# Patient Record
Sex: Male | Born: 1970 | Race: Black or African American | Hispanic: No | Marital: Single | Smoking: Never smoker
Health system: Southern US, Community
[De-identification: ages and names within clinical notes are randomized; demographics above are authoritative.]

## PROBLEM LIST (undated history)

## (undated) DIAGNOSIS — K4090 Unilateral inguinal hernia, without obstruction or gangrene, not specified as recurrent: Secondary | ICD-10-CM

## (undated) HISTORY — PX: OTHER SURGICAL HISTORY: SHX169

## (undated) HISTORY — PX: EYE SURGERY: SHX253

## (undated) HISTORY — DX: Unilateral inguinal hernia, without obstruction or gangrene, not specified as recurrent: K40.90

---

## 1997-09-06 ENCOUNTER — Emergency Department (HOSPITAL_COMMUNITY): Admission: EM | Admit: 1997-09-06 | Discharge: 1997-09-06 | Payer: Self-pay

## 2006-05-05 ENCOUNTER — Emergency Department (HOSPITAL_COMMUNITY): Admission: EM | Admit: 2006-05-05 | Discharge: 2006-05-05 | Payer: Self-pay | Admitting: Emergency Medicine

## 2008-04-30 ENCOUNTER — Emergency Department (HOSPITAL_COMMUNITY): Admission: EM | Admit: 2008-04-30 | Discharge: 2008-04-30 | Payer: Self-pay | Admitting: Emergency Medicine

## 2011-04-27 ENCOUNTER — Emergency Department (INDEPENDENT_AMBULATORY_CARE_PROVIDER_SITE_OTHER)
Admission: EM | Admit: 2011-04-27 | Discharge: 2011-04-27 | Disposition: A | Discharge status: Home / Self Care | Payer: Self-pay | Source: Home / Self Care | Attending: Family Medicine | Admitting: Family Medicine

## 2011-04-27 ENCOUNTER — Encounter (HOSPITAL_COMMUNITY): Payer: Self-pay | Admitting: *Deleted

## 2011-04-27 DIAGNOSIS — H6123 Impacted cerumen, bilateral: Secondary | ICD-10-CM

## 2011-04-27 DIAGNOSIS — M549 Dorsalgia, unspecified: Secondary | ICD-10-CM

## 2011-04-27 DIAGNOSIS — H6120 Impacted cerumen, unspecified ear: Secondary | ICD-10-CM

## 2011-04-27 MED ORDER — HYDROCODONE-ACETAMINOPHEN 5-325 MG PO TABS: ORAL_TABLET | ORAL | Status: AC

## 2011-04-27 MED ORDER — CYCLOBENZAPRINE HCL 5 MG PO TABS: 5.0000 mg | ORAL_TABLET | Freq: Three times a day (TID) | ORAL | Status: AC | PRN

## 2011-04-27 MED ORDER — NAPROXEN 375 MG PO TABS: 375.0000 mg | ORAL_TABLET | Freq: Two times a day (BID) | ORAL | Status: AC

## 2011-04-27 NOTE — ED Provider Notes (Addendum)
History     CSN: [id number]  Arrival date & time 04/27/11  1020   First MD Initiated Contact with Patient 04/27/11 1139      Chief Complaint  Patient presents with  . Back Pain  . Ear Fullness    (Consider location/radiation/quality/duration/timing/severity/associated sxs/prior treatment) HPI Comments: Glenn Kelly presents for evaluation with 2 complaints. He reports decreased hearing in his left ear over the last 2 weeks. He denies any injury to his ear. He reports that he has been using cotton swabs to clean ear wax. He also reports 3 days of low back pain. He reports the pain is in the middle of his back in the lumbar region. 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 He did not use a back brace at this job. He denies any radiation of the pain to his legs. He denies any numbness, tingling, or weakness.  Patient is a 41 y.o. male presenting with back pain and plugged ear sensation. The history is provided by the patient.  Back Pain  This is a new problem. The current episode started more than 2 days ago. The problem occurs constantly. The problem has not changed since onset.The pain is associated with lifting heavy objects. The pain is present in the lumbar spine. The quality of the pain is described as aching. The pain does not radiate. The pain is moderate. The symptoms are aggravated by twisting, bending and certain positions. Pertinent negatives include no numbness, no bowel incontinence, no bladder incontinence, no leg pain, no paresthesias, no paresis, no tingling and no weakness.  Ear Fullness This is a new problem. The current episode started more than 2 days ago. The problem occurs constantly. The problem has not changed since onset.The symptoms are aggravated by nothing. The symptoms are relieved by nothing.    No past medical history on file.  No past surgical history on file.  No family  history on file.  History  Substance Use Topics  . Smoking status: Not on file  . Smokeless tobacco: Not on file  . Alcohol Use: Not on file      Review of Systems  Constitutional: Negative.   HENT: Positive for hearing loss. Negative for ear pain, congestion, rhinorrhea, tinnitus and ear discharge.   Eyes: Negative.   Respiratory: Negative.   Cardiovascular: Negative.   Gastrointestinal: Negative.  Negative for bowel incontinence.  Genitourinary: Negative.  Negative for bladder incontinence.  Musculoskeletal: Positive for back pain.  Skin: Negative.   Neurological: Negative.  Negative for tingling, weakness, numbness and paresthesias.    Allergies  Review of patient's allergies indicates not on file.  Home Medications  No current outpatient prescriptions on file.  BP 116/72  Pulse 68  Temp(Src) 97.6 F (36.4 C) (Oral)  Resp 17  SpO2 97%  Physical Exam  Nursing note and vitals reviewed. Constitutional: He is oriented to person, place, and time. He appears well-developed and well-nourished.  HENT:  Head: Normocephalic and atraumatic.  Right Ear: External ear normal.  Left Ear: External ear normal.  Mouth/Throat: Uvula is midline, oropharynx is clear and moist and mucous membranes are normal.       Bilateral cerumen impaction  Eyes: EOM are normal.  Neck: Normal range of motion.  Pulmonary/Chest: Effort normal.  Musculoskeletal: Normal range of motion.       Lumbar back: He exhibits tenderness.       Back: negative  straight leg raise bilaterally, positive Fabers bilaterally, no pain with internal or external rotation bilaterally, full flexion, extension, abduction, and adduction; 5/5 strength  Neurological: He is alert and oriented to person, place, and time.  Skin: Skin is warm and dry.  Psychiatric: His behavior is normal.    ED Course  Procedures (including critical care time)  Labs Reviewed - No data to display No results found.   No diagnosis  found.    MDM  rx given for naproxen, cyclobenzaprine, and hydrocodone PRN, for back pain, with stretches. Hearing improved after ear irrigation.        Richardo Priest, MD 04/27/11 1323  Richardo Priest, MD 04/27/11 1326

## 2011-04-27 NOTE — ED Notes (Signed)
Pt with c/o left ear decreased hearing x 2 weeks - low back pain x 3 days - no known injury - pain worse with movement - taking ibuprofen

## 2011-05-19 ENCOUNTER — Emergency Department (INDEPENDENT_AMBULATORY_CARE_PROVIDER_SITE_OTHER)
Admission: EM | Admit: 2011-05-19 | Discharge: 2011-05-19 | Disposition: A | Discharge status: Home / Self Care | Payer: Self-pay | Source: Home / Self Care | Attending: Family Medicine | Admitting: Family Medicine

## 2011-05-19 ENCOUNTER — Encounter (HOSPITAL_COMMUNITY): Payer: Self-pay | Admitting: *Deleted

## 2011-05-19 DIAGNOSIS — K644 Residual hemorrhoidal skin tags: Secondary | ICD-10-CM

## 2011-05-19 MED ORDER — HYDROCORTISONE ACETATE 25 MG RE SUPP: 25.0000 mg | Freq: Two times a day (BID) | RECTAL | Status: AC

## 2011-05-19 MED ORDER — POLYETHYLENE GLYCOL 3350 17 GM/SCOOP PO POWD: 17.0000 g | Freq: Every day | ORAL | Status: AC

## 2011-05-19 MED ORDER — PSYLLIUM 30.9 % PO POWD: 1.0000 | ORAL | Status: DC

## 2011-05-19 MED ORDER — DOCUSATE SODIUM 100 MG PO CAPS: 100.0000 mg | ORAL_CAPSULE | Freq: Two times a day (BID) | ORAL | Status: AC

## 2011-05-19 NOTE — ED Provider Notes (Signed)
History     CSN: [id number]  Arrival date & time 05/19/11  1533   First MD Initiated Contact with Patient 05/19/11 1543      Chief Complaint  Patient presents with  . Hemorrhoids  . Rectal Pain    (Consider location/radiation/quality/duration/timing/severity/associated sxs/prior treatment) Patient is a 41 y.o. male presenting with hematochezia. The history is provided by the patient.  Rectal Bleeding  The current episode started today (No blood today but rectal pain and something coming from rectum). The problem occurs continuously. The problem has been unchanged. The pain is severe. The stool is described as hard (unable gto produce a stool today). There was no prior successful therapy. There was no prior unsuccessful therapy. Associated symptoms include rectal pain. Pertinent negatives include no anorexia, no fever, no abdominal pain, no diarrhea, no hematemesis, no nausea, no vomiting, no hematuria, no chest pain, no coughing, no difficulty breathing and no rash.    History reviewed. No pertinent past medical history.  Past Surgical History  Procedure Date  . Congenital heart      History reviewed. No pertinent family history.  History  Substance Use Topics  . Smoking status: Never Smoker   . Smokeless tobacco: Not on file  . Alcohol Use: No      Review of Systems  Constitutional: Negative for fever.  Respiratory: Negative for cough.   Cardiovascular: Negative for chest pain.  Gastrointestinal: Positive for hematochezia and rectal pain. Negative for nausea, vomiting, abdominal pain, diarrhea, anorexia and hematemesis.  Genitourinary: Negative for hematuria.  Skin: Negative for rash.  All other systems reviewed and are negative.    Allergies  Review of patient's allergies indicates no known allergies.  Home Medications   Current Outpatient Rx  Name Route Sig Dispense Refill  . HYDROCODONE-ACETAMINOPHEN 5-325 MG PO TABS Oral Take 1 tablet by mouth every 6  (six) hours as needed.    Marland Kitchen DOCUSATE SODIUM 100 MG PO CAPS Oral Take 1 capsule (100 mg total) by mouth 2 (two) times daily. 20 capsule 0  . HYDROCORTISONE ACETATE 25 MG RE SUPP Rectal Place 1 suppository (25 mg total) rectally 2 (two) times daily. 24 suppository 0  . IBUPROFEN 400 MG PO TABS Oral Take 400 mg by mouth every 6 (six) hours as needed.    Marland Kitchen NAPROXEN 375 MG PO TABS Oral Take 1 tablet (375 mg total) by mouth 2 (two) times daily. 20 tablet 0  . POLYETHYLENE GLYCOL 3350 PO POWD Oral Take 17 g by mouth daily. 255 g 0  . PSYLLIUM 30.9 % PO POWD Oral Take 1 applicator by mouth 1 day or 1 dose. 1 Bottle 1    BP 129/88  Pulse 82  Temp(Src) 98.2 F (36.8 C) (Oral)  Resp 16  SpO2 98%  Physical Exam  Vitals reviewed. Constitutional: He is oriented to person, place, and time. He appears well-developed.  HENT:  Head: Normocephalic.  Genitourinary: Prostate normal. Rectal exam shows external hemorrhoid. Rectal exam shows no internal hemorrhoid, no fissure, no mass, no tenderness and anal tone normal. Guaiac negative stool. Prostate is not enlarged and not tender.       Large hemorrhoid not thrombosed yet at 08:00  Neurological: He is alert and oriented to person, place, and time.  Skin: Skin is warm and dry.  Psychiatric: He has a normal mood and affect. His behavior is normal.    ED Course  Procedures (including critical care time)  Labs Reviewed - No data to display No results  found.   1. External hemorrhoid       MDM          Hassan Rowan, MD 05/19/11 2142

## 2011-05-19 NOTE — ED Notes (Signed)
Pt with onset of rectal pain x 2 days ? Hemorrhoids pt also with c/o intermittent toothache

## 2011-05-19 NOTE — Discharge Instructions (Signed)
If hemorrhoid gets worse it may require a trip for thombectomy. In the future any narcotic use may cause a reoccurence.     High Fiber Diet A high fiber diet changes your normal diet to include more whole grains, legumes, fruits, and vegetables. Changes in the diet involve replacing refined carbohydrates with unrefined foods. The calorie level of the diet is essentially unchanged. The Dietary Reference Intake (recommended amount) for adult males is 38 g per day. For adult females, it is 25 g per day. Pregnant and lactating women should consume 28 g of fiber per day. Fiber is the intact part of a plant that is not broken down during digestion. Functional fiber is fiber that has been isolated from the plant to provide a beneficial effect in the body. PURPOSE  Increase stool bulk.   Ease and regulate bowel movements.   Lower cholesterol.  INDICATIONS THAT YOU NEED MORE FIBER  Constipation and hemorrhoids.   Uncomplicated diverticulosis (intestine condition) and irritable bowel syndrome.   Weight management.   As a protective measure against hardening of the arteries (atherosclerosis), diabetes, and cancer.  NOTE OF CAUTION If you have a digestive or bowel problem, ask your caregiver for advice before adding high fiber foods to your diet. Some of the following medical problems are such that a high fiber diet should not be used without consulting your caregiver:  Acute diverticulitis (intestine infection).   Partial small bowel obstructions.   Complicated diverticular disease involving bleeding, rupture (perforation), or abscess (boil, furuncle).   Presence of autonomic neuropathy (nerve damage) or gastric paresis (stomach cannot empty itself).  GUIDELINES FOR INCREASING FIBER  Start adding fiber to the diet slowly. A gradual increase of about 5 more grams (2 slices of whole-wheat bread, 2 servings of most fruits or vegetables, or 1 bowl of high fiber cereal) per day is best. Too rapid  an increase in fiber may result in constipation, flatulence, and bloating.   Drink enough water and fluids to keep your urine clear or pale yellow. Water, juice, or caffeine-free drinks are recommended. Not drinking enough fluid may cause constipation.   Eat a variety of high fiber foods rather than one type of fiber.   Try to increase your intake of fiber through using high fiber foods rather than fiber pills or supplements that contain small amounts of fiber.   The goal is to change the types of food eaten. Do not supplement your present diet with high fiber foods, but replace foods in your present diet.  INCLUDE A VARIETY OF FIBER SOURCES  Replace refined and processed grains with whole grains, canned fruits with fresh fruits, and incorporate other fiber sources. White rice, white breads, and most bakery goods contain little or no fiber.   Brown whole-grain rice, buckwheat oats, and many fruits and vegetables are all good sources of fiber. These include: broccoli, Brussels sprouts, cabbage, cauliflower, beets, sweet potatoes, white potatoes (skin on), carrots, tomatoes, eggplant, squash, berries, fresh fruits, and dried fruits.   Cereals appear to be the richest source of fiber. Cereal fiber is found in whole grains and bran. Bran is the fiber-rich outer coat of cereal grain, which is largely removed in refining. In whole-grain cereals, the bran remains. In breakfast cereals, the largest amount of fiber is found in those with "bran" in their names. The fiber content is sometimes indicated on the label.   You may need to include additional fruits and vegetables each day.   In baking, for 1 cup  white flour, you may use the following substitutions:   1 cup whole-wheat flour minus 2 tbs.    cup white flour plus  cup whole-wheat flour.  Document Released: 03/11/2005 Document Revised: 11/21/2010 Document Reviewed: 01/17/2009 [facility] Patient Information 2012 [place],  [place].Hemorrhoids Hemorrhoids are enlarged (dilated) veins around the rectum. There are 2 types of hemorrhoids, and the type of hemorrhoid is determined by its location. Internal hemorrhoids occur in the veins just inside the rectum.They are usually not painful, but they may bleed.However, they may poke through to the outside and become irritated and painful. External hemorrhoids involve the veins outside the anus and can be felt as a painful swelling or hard lump near the anus.They are often itchy and may crack and bleed. Sometimes clots will form in the veins. This makes them swollen and painful. These are called thrombosed hemorrhoids. CAUSES Causes of hemorrhoids include:  Pregnancy. This increases the pressure in the hemorrhoidal veins.   Constipation.   Straining to have a bowel movement.   Obesity.   Heavy lifting or other activity that caused you to strain.  TREATMENT Most of the time hemorrhoids improve in 1 to 2 weeks. However, if symptoms do not seem to be getting better or if you have a lot of rectal bleeding, your caregiver may perform a procedure to help make the hemorrhoids get smaller or remove them completely.Possible treatments include:  Rubber band ligation. A rubber band is placed at the base of the hemorrhoid to cut off the circulation.   Sclerotherapy. A chemical is injected to shrink the hemorrhoid.   Infrared light therapy. Tools are used to burn the hemorrhoid.   Hemorrhoidectomy. This is surgical removal of the hemorrhoid.  HOME CARE INSTRUCTIONS   Increase fiber in your diet. Ask your caregiver about using fiber supplements.   Drink enough water and fluids to keep your urine clear or pale yellow.   Exercise regularly.   Go to the bathroom when you have the urge to have a bowel movement. Do not wait.   Avoid straining to have bowel movements.   Keep the anal area dry and clean.   Only take over-the-counter or prescription medicines for pain,  discomfort, or fever as directed by your caregiver.  If your hemorrhoids are thrombosed:  Take warm sitz baths for 20 to 30 minutes, 3 to 4 times per day.   If the hemorrhoids are very tender and swollen, place ice packs on the area as tolerated. Using ice packs between sitz baths may be helpful. Fill a plastic bag with ice. Place a towel between the bag of ice and your skin.   Medicated creams and suppositories may be used or applied as directed.   Do not use a donut-shaped pillow or sit on the toilet for long periods. This increases blood pooling and pain.  SEEK MEDICAL CARE IF:   You have increasing pain and swelling that is not controlled with your medicine.   You have uncontrolled bleeding.   You have difficulty or you are unable to have a bowel movement.   You have pain or inflammation outside the area of the hemorrhoids.   You have chills or an oral temperature above 102 F (38.9 C).  MAKE SURE YOU:   Understand these instructions.   Will watch your condition.   Will get help right away if you are not doing well or get worse.  Document Released: 03/08/2000 Document Revised: 11/21/2010 Document Reviewed: 07/14/2007 [facility] Patient Information 2012 [place], [place].

## 2012-08-16 ENCOUNTER — Emergency Department (HOSPITAL_COMMUNITY)
Admission: EM | Admit: 2012-08-16 | Discharge: 2012-08-16 | Disposition: A | Discharge status: Home / Self Care | Payer: Self-pay | Source: Home / Self Care | Attending: Family Medicine | Admitting: Family Medicine

## 2012-08-16 ENCOUNTER — Encounter (HOSPITAL_COMMUNITY): Payer: Self-pay | Admitting: *Deleted

## 2012-08-16 DIAGNOSIS — K602 Anal fissure, unspecified: Secondary | ICD-10-CM

## 2012-08-16 MED ORDER — HYDROCORTISONE ACE-PRAMOXINE 2.5-1 % RE CREA: TOPICAL_CREAM | Freq: Three times a day (TID) | RECTAL | Status: DC

## 2012-08-16 MED ORDER — POLYETHYLENE GLYCOL 3350 17 GM/SCOOP PO POWD: 17.0000 g | Freq: Every day | ORAL | Status: DC

## 2012-08-16 NOTE — ED Notes (Signed)
Hurts to have a BM onset 1 week ago.  Passed a little blood 2 days ago.  He had a small hemorrhoid 2 mos. Ago.  Tried Tucks with some relief.

## 2012-08-16 NOTE — ED Provider Notes (Signed)
History     CSN: [id number]  Arrival date & time 08/16/12  1407   First MD Initiated Contact with Patient 08/16/12 1556      Chief Complaint  Patient presents with  . Hemorrhoids    (Consider location/radiation/quality/duration/timing/severity/associated sxs/prior treatment) HPI Comments: 42 year old male with history of constipation and hemorrhoids. Here complaining of painful bowel movements for one week. Had seen bloodstained tissue after defecation 2 days ago. He has been diagnosed with external hemorrhoids in the past. Reports the pain is mostly during defecation. Denies throbbing pain. Reports pain is like burning type. Has tried topics with relief. Last bowel movement was this morning runny stools had hard stools the day before. Denies abdominal pain. No general malaise. No nausea or vomiting.   History reviewed. No pertinent past medical history.  Past Surgical History  Procedure Laterality Date  . Congenital heart       History reviewed. No pertinent family history.  History  Substance Use Topics  . Smoking status: Never Smoker   . Smokeless tobacco: Not on file  . Alcohol Use: No      Review of Systems  Constitutional: Negative for fever, chills and fatigue.  Gastrointestinal: Positive for constipation, blood in stool and rectal pain. Negative for nausea, vomiting, abdominal pain and diarrhea.  Musculoskeletal: Negative for back pain.  All other systems reviewed and are negative.    Allergies  Review of patient's allergies indicates no known allergies.  Home Medications   Current Outpatient Rx  Name  Route  Sig  Dispense  Refill  . hydrocortisone-pramoxine [facility]) 2.5-1 % rectal cream   Rectal   Place rectally 3 (three) times daily.   30 g   0   . polyethylene glycol powder (GLYCOLAX/MIRALAX) powder   Oral   Take 17 g by mouth daily.   255 g   0     BP 110/80  Pulse 66  Temp(Src) 97.6 F (36.4 C) (Oral)  Resp 16  SpO2  100%  Physical Exam  Nursing note and vitals reviewed. Constitutional: He is oriented to person, place, and time. He appears well-developed and well-nourished. No distress.  HENT:  Mouth/Throat: Oropharynx is clear and moist.  Eyes: No scleral icterus.  Neck: No JVD present.  Cardiovascular: Normal heart sounds.   Pulmonary/Chest: Breath sounds normal.  Abdominal: Soft. Bowel sounds are normal. He exhibits no distension and no mass. There is no tenderness. There is no rebound and no guarding.  Genitourinary: Guaiac negative stool.  Rectal digital exam: impress normal anal tone. No obvious external or internal hemorrhoids. There are few rectal excoriations. There is a healing rectal fissure at 7 oclock with a sentinel skin tag. No rectal mass. Rectoscopy: patient no cooperative. Unable to pass rectal speculum as patient was uncomfortable and increased gluteal and perirectal muscle tone. Exam was deferred.  Neurological: He is alert and oriented to person, place, and time.  Skin: He is not diaphoretic.    ED Course  Procedures (including critical care time)  Labs Reviewed - No data to display No results found.   1. Anal fissure       MDM  Prescribed hydrocortisone/pramoxine cream. Prescribed MiraLax when necessary. Supportive care and red flags that should prompt his return to medical attention discussed with patient and provided in writing. And        Sharin Grave, MD 08/17/12 1844

## 2012-08-25 NOTE — ED Notes (Signed)
E-mail message to Dr Tressia Danas regarding pt inquiry to see if anything else can be done

## 2012-08-25 NOTE — ED Notes (Signed)
Chart review. patient called, asking if there is anything else he can do about his pain , as sometimes the cream helps, sometimes not; what elae can he do about the pain

## 2014-01-06 ENCOUNTER — Emergency Department (HOSPITAL_COMMUNITY)
Admission: EM | Admit: 2014-01-06 | Discharge: 2014-01-06 | Disposition: A | Discharge status: Home / Self Care | Payer: Self-pay | Attending: Emergency Medicine | Admitting: Emergency Medicine

## 2014-01-06 ENCOUNTER — Encounter (HOSPITAL_COMMUNITY): Payer: Self-pay | Admitting: Emergency Medicine

## 2014-01-06 ENCOUNTER — Emergency Department (HOSPITAL_COMMUNITY): Payer: Self-pay

## 2014-01-06 DIAGNOSIS — R059 Cough, unspecified: Secondary | ICD-10-CM

## 2014-01-06 DIAGNOSIS — J029 Acute pharyngitis, unspecified: Secondary | ICD-10-CM | POA: Insufficient documentation

## 2014-01-06 DIAGNOSIS — R05 Cough: Secondary | ICD-10-CM

## 2014-01-06 LAB — RAPID STREP SCREEN (MED CTR MEBANE ONLY): STREPTOCOCCUS, GROUP A SCREEN (DIRECT): NEGATIVE

## 2014-01-06 MED ORDER — AZITHROMYCIN 250 MG PO TABS: ORAL_TABLET | ORAL | Status: DC

## 2014-01-06 MED ORDER — DEXAMETHASONE SODIUM PHOSPHATE 10 MG/ML IJ SOLN: 10.0000 mg | Freq: Once | INTRAMUSCULAR | Status: DC

## 2014-01-06 MED ORDER — DEXAMETHASONE 6 MG PO TABS
10.0000 mg | ORAL_TABLET | Freq: Once | ORAL | Status: AC
  Administered 2014-01-06: 10 mg via ORAL
  Filled 2014-01-06: qty 1

## 2014-01-06 MED ORDER — IBUPROFEN 200 MG PO TABS
600.0000 mg | ORAL_TABLET | Freq: Once | ORAL | Status: AC
  Administered 2014-01-06: 600 mg via ORAL
  Filled 2014-01-06: qty 3

## 2014-01-06 NOTE — ED Notes (Signed)
Pt ambulated to XRAY with steady gait 

## 2014-01-06 NOTE — ED Notes (Signed)
Pt ambulated to restroom with steady gait.

## 2014-01-06 NOTE — ED Notes (Signed)
Pt c/o sore throat and cold symptoms. Pt states started about 1 week ago, but not getting any better.

## 2014-01-06 NOTE — ED Provider Notes (Signed)
CSN: [id number]     Arrival date & time 01/06/14  0018 History   First MD Initiated Contact with Patient 01/06/14 0035     Chief Complaint  Patient presents with  . Sore Throat  . Nasal Congestion     (Consider location/radiation/quality/duration/timing/severity/associated sxs/prior Treatment) Patient is a 43 y.o. male presenting with pharyngitis.  Sore Throat This is a new problem. Episode onset: 1 week ago. The problem occurs constantly. The problem has not changed since onset.Pertinent negatives include no chest pain, no abdominal pain, no headaches and no shortness of breath. Nothing aggravates the symptoms. Nothing relieves the symptoms. He has tried nothing for the symptoms.    History reviewed. No pertinent past medical history. Past Surgical History  Procedure Laterality Date  . Congenital heart     . Eye surgery     History reviewed. No pertinent family history. History  Substance Use Topics  . Smoking status: Never Smoker   . Smokeless tobacco: Never Used  . Alcohol Use: No    Review of Systems  Respiratory: Negative for shortness of breath.   Cardiovascular: Negative for chest pain.  Gastrointestinal: Negative for abdominal pain.  Neurological: Negative for headaches.  All other systems reviewed and are negative.     Allergies  Review of patient's allergies indicates no known allergies.  Home Medications   Prior to Admission medications   Medication Sig Start Date End Date Taking? Authorizing Provider  acetaminophen (TYLENOL) 500 MG tablet Take 1,000 mg by mouth every 6 (six) hours as needed for mild pain.   Yes Historical Provider, MD  DM-Doxylamine-Acetaminophen 15-6.25-325 MG/15ML LIQD Take 30 mLs by mouth at bedtime as needed (sore throat).   Yes Historical Provider, MD  phenol (CHLORASEPTIC) 1.4 % LIQD Use as directed 1 spray in the mouth or throat as needed for throat irritation / pain.   Yes Historical Provider, MD  azithromycin (ZITHROMAX Z-PAK)  250 MG tablet Take 2 pills (500mg ) PO on day 1, followed by 1 pill PO daily x 4 days 01/06/14   Mirian MoMatthew Gentry, MD   BP 129/79  Pulse 69  Temp(Src) 98.8 F (37.1 C) (Oral)  Resp 16  SpO2 100% Physical Exam  Vitals reviewed. Constitutional: He is oriented to person, place, and time. He appears well-developed and well-nourished.  HENT:  Head: Normocephalic and atraumatic.  Mouth/Throat: Oropharynx is clear and moist and mucous membranes are normal.  Eyes: Conjunctivae and EOM are normal.  Neck: Normal range of motion. Neck supple.  Cardiovascular: Normal rate, regular rhythm and normal heart sounds.   Pulmonary/Chest: Effort normal and breath sounds normal. No respiratory distress.  Abdominal: He exhibits no distension. There is no tenderness. There is no rebound and no guarding.  Musculoskeletal: Normal range of motion.  Neurological: He is alert and oriented to person, place, and time.  Skin: Skin is warm and dry.    ED Course  Procedures (including critical care time) Labs Review Labs Reviewed  RAPID STREP SCREEN  CULTURE, GROUP A STREP    Imaging Review Dg Chest 2 View  01/06/2014   CLINICAL DATA:  Cough, sore throat, cold symptoms. Initial encounter.  EXAM: CHEST  2 VIEW  COMPARISON:  None.  FINDINGS: Normal cardiac silhouette and mediastinal contours. Evaluation of the retrosternal clear space obscured secondary to overlying soft tissues. Ill-defined heterogeneous opacities within the left lower lung. No pleural effusion or pneumothorax. There is mild eventration of the anterior aspect the right hemidiaphragm. Unchanged bones.  IMPRESSION: Left lower lung/  retrocardiac heterogeneous opacities, atelectasis versus infiltrate. A follow-up chest radiograph in 4 to 6 weeks after treatment is recommended to ensure resolution.   Electronically Signed   By: Simonne ComeJohn  Watts M.D.   On: 01/06/2014 02:36     EKG Interpretation None      MDM   Final diagnoses:  Cough  Sore throat     43 y.o. male without pertinent PMH presents with sore throat x 1 week, no fevers, as well as cough.  On arrival vitals and physical exam as above.  CXR with ? LLL infiltrate.    DC home with standard return precautions and azithromycin.   1. Cough   2. Sore throat         Mirian MoMatthew Gentry, MD 01/06/14 [phone]

## 2014-01-06 NOTE — Discharge Instructions (Signed)
Pneumonia °Pneumonia is an infection of the lungs.  °CAUSES °Pneumonia may be caused by bacteria or a virus. Usually, these infections are caused by breathing infectious particles into the lungs (respiratory tract). °SIGNS AND SYMPTOMS  °· Cough. °· Fever. °· Chest pain. °· Increased rate of breathing. °· Wheezing. °· Mucus production. °DIAGNOSIS  °If you have the common symptoms of pneumonia, your health care provider will typically confirm the diagnosis with a chest X-ray. The X-ray will show an abnormality in the lung (pulmonary infiltrate) if you have pneumonia. Other tests of your blood, urine, or sputum may be done to find the specific cause of your pneumonia. Your health care provider may also do tests (blood gases or pulse oximetry) to see how well your lungs are working. °TREATMENT  °Some forms of pneumonia may be spread to other people when you cough or sneeze. You may be asked to wear a mask before and during your exam. Pneumonia that is caused by bacteria is treated with antibiotic medicine. Pneumonia that is caused by the influenza virus may be treated with an antiviral medicine. Most other viral infections must run their course. These infections will not respond to antibiotics.  °HOME CARE INSTRUCTIONS  °· Cough suppressants may be used if you are losing too much rest. However, coughing protects you by clearing your lungs. You should avoid using cough suppressants if you can. °· Your health care provider may have prescribed medicine if he or she thinks your pneumonia is caused by bacteria or influenza. Finish your medicine even if you start to feel better. °· Your health care provider may also prescribe an expectorant. This loosens the mucus to be coughed up. °· Take medicines only as directed by your health care provider. °· Do not smoke. Smoking is a common cause of bronchitis and can contribute to pneumonia. If you are a smoker and continue to smoke, your cough may last several weeks after your  pneumonia has cleared. °· A cold steam vaporizer or humidifier in your room or home may help loosen mucus. °· Coughing is often worse at night. Sleeping in a semi-upright position in a recliner or using a couple pillows under your head will help with this. °· Get rest as you feel it is needed. Your body will usually let you know when you need to rest. °PREVENTION °A pneumococcal shot (vaccine) is available to prevent a common bacterial cause of pneumonia. This is usually suggested for: °· People over 65 years old. °· Patients on chemotherapy. °· People with chronic lung problems, such as bronchitis or emphysema. °· People with immune system problems. °If you are over 65 or have a high risk condition, you may receive the pneumococcal vaccine if you have not received it before. In some countries, a routine influenza vaccine is also recommended. This vaccine can help prevent some cases of pneumonia. You may be offered the influenza vaccine as part of your care. °If you smoke, it is time to quit. You may receive instructions on how to stop smoking. Your health care provider can provide medicines and counseling to help you quit. °SEEK MEDICAL CARE IF: °You have a fever. °SEEK IMMEDIATE MEDICAL CARE IF:  °· Your illness becomes worse. This is especially true if you are elderly or weakened from any other disease. °· You cannot control your cough with suppressants and are losing sleep. °· You begin coughing up blood. °· You develop pain which is getting worse or is uncontrolled with medicines. °· Any of the symptoms   which initially brought you in for treatment are getting worse rather than better. °· You develop shortness of breath or chest pain. °MAKE SURE YOU:  °· Understand these instructions. °· Will watch your condition. °· Will get help right away if you are not doing well or get worse. °Document Released: 03/11/2005 Document Revised: 07/26/2013 Document Reviewed: 05/31/2010 °ExitCare® Patient Information ©2015  [employer], LLC. This information is not intended to replace advice given to you by your health care provider. Make sure you discuss any questions you have with your health care provider. ° ° °Emergency Department Resource Guide °1) Find a Doctor and Pay Out of Pocket °Although you won't have to find out who is covered by your insurance plan, it is a good idea to ask around and get recommendations. You will then need to call the office and see if the doctor you have chosen will accept you as a new patient and what types of options they offer for patients who are self-pay. Some doctors offer discounts or will set up payment plans for their patients who do not have insurance, but you will need to ask so you aren't surprised when you get to your appointment. ° °2) Contact Your Local Health Department °Not all health departments have doctors that can see patients for sick visits, but many do, so it is worth a call to see if yours does. If you don't know where your local health department is, you can check in your phone book. The CDC also has a tool to help you locate your state's health department, and many state websites also have listings of all of their local health departments. ° °3) Find a Walk-in Clinic °If your illness is not likely to be very severe or complicated, you may want to try a walk in clinic. These are popping up all over the country in pharmacies, drugstores, and shopping centers. They're usually staffed by nurse practitioners or physician assistants that have been trained to treat common illnesses and complaints. They're usually fairly quick and inexpensive. However, if you have serious medical issues or chronic medical problems, these are probably not your best option. ° °No Primary Care Doctor: °- Call [facility] at  832-8000 - they can help you locate a primary care doctor that  accepts your insurance, provides certain services, etc. °- Physician Referral Service- 1-800-533-3463 ° °Chronic Pain  Problems: °Organization         Address  Phone   Notes  °[facility]  (336) 297-2271 Patients need to be referred by their primary care doctor.  ° °Medication Assistance: °Organization         Address  Phone   Notes  °[place] [facility] [address] °[place], [place] [address] (336) 641-8030 --Must be a resident of [place] °-- Must have NO insurance coverage whatsoever (no Medicaid/ Medicare, etc.) °-- The pt. MUST have a primary care doctor that directs their care regularly and follows them in the community °  °[facility]  (866) 331-1348   °[employer] Way  (888) 892-1162   ° °Agencies that provide inexpensive medical care: °Organization         Address  Phone   Notes  °[facility]  (336) 832-8035   °[facility]    (336) 832-7272   °[facility] [address] °[place], [place] [address] (336) 832-4777   °[facility] [address], °[place] (336) 271-4999   °  [facility]anned [facility]    (336) 373-0678   °[facility]    (336) 272-1050   °[facility] ° [address], [place] Phone:  (336) 832-4444, Fax:  (336) 832-4440 Hours of Operation:  9 am - 6 pm, M-F.  Also accepts Medicaid/Medicare and self-pay.  °[facility] ° [address], [place] Phone: (336) 832-3150, Fax: (336) [phone]. Hours of Operation:  8:30 am - 5:30 pm, M-F.  Also accepts Medicaid and self-pay.  °[facility] [place] [address], [place] Phone: (336) 878-6027   °[facility] [address], [place], [place] (336)723-1848, Ext. 123 Mondays & Thursdays: 7-9 AM.  First 15 patients are seen on a first come, first serve basis. °  ° °Medicaid-accepting [place] Providers: ° °Organization         Address  Phone   Notes  °[facility] [address], [place] (336) 641-2100 Also  accepts self-pay patients.  °[facility] [address], [place] ° (336) 856-9996   °[facility] [address], [place] (336) 288-8857   °[facility] [address] [place] [address], [place] (336) 299-7000   °Veita Bland [address], [place]  ° (336) 373-1557 Only accepts [place] Access Medicaid patients after they have their name applied to their card.  ° °Self-Pay (no insurance) in [place]: ° °Organization         Address  Phone   Notes  °Sickle Cell Patients, [facility] [address], [place] (336) 832-1970   °[facility] [address], [place] (336) 832-4400   ° [facility] ° [address], [place] (336) 992-4800   °[facility]/Dr. Osei-Bonsu ° [address] [place] [address], [place] or [address], [place] (336) 841-8500 Phone number for both [place] and [place] locations is the same.  °[facility] [address], [place] (336) 299-0000   °[facility] [address] [place] [address], [place] or [address] (336) 852-7530 °(336) 878-2260   °[facility] [address], [place] (336) 350-1642, phone; (336) 294-5005, fax Sees patients 1st and 3rd Saturday of every month.  Must not qualify for public or private insurance (i.e. Medicaid, Medicare, [place] Health Choice, Veterans' Benefits) • Household income should be no more than 200% of the poverty level •The clinic cannot treat you if you are pregnant or think you are pregnant • Sexually transmitted diseases are not treated at the clinic.  ° ° °Dental Care: °Organization         Address  Phone  Notes  °[place] [facility] [address], [place] (336) 641-6152 Accepts children up to age 21 who are enrolled in Medicaid or [place] Health Choice; pregnant  women with a Medicaid card; and children who have applied for Medicaid or [place] Health Choice, but were declined, whose parents can pay a reduced fee at time of service.  °[facility] [place]  [address], [place] (336) 641-7733 Accepts children up to age 21 who are enrolled in Medicaid or [place] Health Choice; pregnant women with a Medicaid card; and children who have applied for Medicaid or  Health Choice, but were declined,   whose parents can pay a reduced fee at time of service.  °[facility] ° [address], [place] (336) 641-4533 Patients are seen by appointment only. Walk-ins are not accepted. [facility] will see patients 18 years of age and older. °Monday - Tuesday (8am-5pm) °Most Wednesdays (8:30-5pm) °$30 per visit, cash only  °[facility] ° [address], [place] (336) 641-4533 Patients are seen by appointment only. Walk-ins are not accepted. [facility] will see patients 18 years of age and older. °One Wednesday Evening (Monthly: Volunteer Based).  $30 per visit, cash only  °[facility]  (919) [phone] for adults; Children under age 4, call Graduate Pediatric Dentistry at ([phone]) [phone]. Children aged 4-14, please call ([phone]) [phone] to request a pediatric application. ° Dental services are provided in all areas of dental care including fillings, crowns and bridges, complete and partial dentures, implants, gum treatment, root canals, and extractions. Preventive care is also provided. Treatment is provided to both adults and children. °Patients are selected via a lottery and there is often a waiting list. °  °[facility] [address], °[place] ° (336) 763-8833 [email or web address] °  °[facility] [address], [place], [place] (336)723-1848, Ext. 123 Second and Fourth Thursday of each month, opens at 6:30 AM; Clinic ends at 9 AM.  Patients are  seen on a first-come first-served basis, and a limited number are seen during each clinic.  ° °[facility] ° [address], [place], [place] (336) 723-7904   Eligibility Requirements °You must have lived in [place], [place], or [place] counties for at least the last three months. °  You cannot be eligible for state or federal sponsored healthcare insurance, including Veterans Administration, Medicaid, or Medicare. °  You generally cannot be eligible for healthcare insurance through your employer.  °  How to apply: °Eligibility screenings are held every Tuesday and Wednesday afternoon from 1:00 pm until 4:00 pm. You do not need an appointment for the interview!  °[facility] [address], [place], [place] 336-631-2330   °[facility]  336-342-8273   °[facility]  336-703-3100   °[facility]  336-570-6415   ° °Behavioral Health Resources in the Community: °Intensive Outpatient Programs °Organization         Address  Phone  Notes  °[facility] [address], [place], [place] 336-878-6098   °[facility] [address], [place], [place] 336-832-9800   °[facility] [address], [place], [place] ° 336-882-2125   °[facility] [address],  °[place], [place] 1-800-853-5163 or 336-641-4981   °Substance Abuse Resources °Organization         Address  Phone  Notes  °[facility]  336-882-2125   °[facility]  336-784-9470   °[facility]  336-285-9073   °[facility]  336-845-3988   °Residential & Outpatient Substance Abuse Program  1-800-659-3381   °Psychological Services °Organization         Address  Phone  Notes  ° Health  336- 832-9600   °[facility]  336- 378-7881   °[facility] [address],  1-800-853-5163 or 336-641-4981   ° °Mobile Crisis  Teams °Organization         Address  Phone  Notes  °[facility]   Crisis Care Unit  [phone]   [facility]  [address] [place], [place] [phone]   [facility] [address] [place] [phone]    Self-Help/Support Groups Organization         Address  Phone             Notes  [facility] [place] - variety of support groups  336- I7437963(670)130-5702 Call for more information  Narcotics Anonymous (NA), [facility] [address], [place]  2 meetings at this location   [facility] Programs Organization         Address  Phone  Notes  [facility] [address],    [place]  [phone]   [facility]  [address], [place] [phone], [place], [place] [phone]   [facility] [address], [place] [phone] Admissions: 8am-3pm M-F  [facility] [address]    [place], [place] [phone]   [facility] [address], [place], [place] [phone]   [facility] [address]  [place], [place] [phone]   [facility] Int[facility] Outpatient [address], [place], [place] [phone]   [facility] [address]  [place], [place] [phone] or [phone]) [phone]   [facility] [address] [place], [place] [phone] Accepts Medicaid  [facility] [address]  [place] [phone] Substance Abuse/Addiction Treatment   [facility] Organization         Address  Phone  Notes  [facility]  [phone](888) [phone]   Angie FavaJulie Brannon, PhD [address], [address] [place], [place]   [phone](336) [phone] or [phone](336) [phone]   [facility]   [address] [place], [place] [phone]) [phone]   [facility] [address], [place], [place] [phone]) [phone]  Insurance/Medicaid/sponsorship through [facility]  [facility] [address]                                    [place], [place] [phone]) [phone] Therapy/tele-psych/case  [facility] [address]   [place], [place] [phone](336) [phone]    Dr. Lolly MustacheArfeen  [phone]) [phone]   [facility]  [facility] 1) [address],  2) [address], [place] 3)  [address], [place] [phone]) [phone](336) [phone]  [phone](336) [phone]   [facility] Child Abuse Hotline 343-638-2523(336) 308-570-4939 or [phone](336) [phone] (After Hours)      Sore Throat A sore throat is pain, burning, irritation, or scratchiness of the throat. There is often pain or tenderness when swallowing or talking. A sore throat may be accompanied by other symptoms, such as coughing, sneezing, fever, and swollen neck glands. A sore throat is often the first sign of another sickness, such as a cold, flu, strep throat, or mononucleosis (commonly known as mono). Most sore throats go away without medical treatment. CAUSES  The most common causes of a sore throat include:  A viral infection, such as a cold, flu, or mono.  A bacterial infection, such as strep throat, tonsillitis, or whooping cough.  Seasonal allergies.  Dryness in the air.  Irritants, such as smoke or pollution.  Gastroesophageal reflux disease (GERD). HOME CARE INSTRUCTIONS   Only take over-the-counter  medicines as directed by your caregiver.  Drink enough fluids to keep your urine clear or pale yellow.  Rest as needed.  Try using throat sprays, lozenges, or sucking on hard candy to ease any pain (if older than 4 years or as directed).  Sip warm liquids, such as broth, herbal tea, or warm water with honey to relieve pain temporarily. You may also eat or drink cold or frozen liquids such as frozen ice pops.  Gargle with salt water (mix 1 tsp salt with 8 oz of water).  Do not smoke and avoid secondhand  smoke.  Put a cool-mist humidifier in your bedroom at night to moisten the air. You can also turn on a hot shower and sit in the bathroom with the door closed for 5-10 minutes. SEEK IMMEDIATE MEDICAL CARE IF:  You have difficulty breathing.  You are unable to swallow fluids, soft foods, or your saliva.  You have increased swelling in the throat.  Your sore throat does not get better in 7 days.  You have nausea and vomiting.  You have a fever or persistent symptoms for more than 2-3 days.  You have a fever and your symptoms suddenly get worse. MAKE SURE YOU:   Understand these instructions.  Will watch your condition.  Will get help right away if you are not doing well or get worse. Document Released: 04/18/2004 Document Revised: 02/26/2012 Document Reviewed: 11/17/2011 [facility] Patient Information 2015 [place], [place]. This information is not intended to replace advice given to you by your health care provider. Make sure you discuss any questions you have with your health care provider.

## 2014-01-07 LAB — CULTURE, GROUP A STREP

## 2015-04-06 ENCOUNTER — Emergency Department (HOSPITAL_COMMUNITY)
Admission: EM | Admit: 2015-04-06 | Discharge: 2015-04-06 | Disposition: A | Discharge status: Home / Self Care | Payer: No Typology Code available for payment source | Attending: Emergency Medicine | Admitting: Emergency Medicine

## 2015-04-06 ENCOUNTER — Encounter (HOSPITAL_COMMUNITY): Payer: Self-pay

## 2015-04-06 DIAGNOSIS — S39012A Strain of muscle, fascia and tendon of lower back, initial encounter: Secondary | ICD-10-CM | POA: Diagnosis not present

## 2015-04-06 DIAGNOSIS — S4992XA Unspecified injury of left shoulder and upper arm, initial encounter: Secondary | ICD-10-CM | POA: Diagnosis not present

## 2015-04-06 DIAGNOSIS — Y92410 Unspecified street and highway as the place of occurrence of the external cause: Secondary | ICD-10-CM | POA: Insufficient documentation

## 2015-04-06 DIAGNOSIS — S0990XA Unspecified injury of head, initial encounter: Secondary | ICD-10-CM | POA: Diagnosis not present

## 2015-04-06 DIAGNOSIS — S161XXA Strain of muscle, fascia and tendon at neck level, initial encounter: Secondary | ICD-10-CM

## 2015-04-06 DIAGNOSIS — S3992XA Unspecified injury of lower back, initial encounter: Secondary | ICD-10-CM | POA: Diagnosis present

## 2015-04-06 DIAGNOSIS — Y9389 Activity, other specified: Secondary | ICD-10-CM | POA: Insufficient documentation

## 2015-04-06 DIAGNOSIS — Z79899 Other long term (current) drug therapy: Secondary | ICD-10-CM | POA: Diagnosis not present

## 2015-04-06 DIAGNOSIS — Y998 Other external cause status: Secondary | ICD-10-CM | POA: Insufficient documentation

## 2015-04-06 MED ORDER — IBUPROFEN 800 MG PO TABS: 800.0000 mg | ORAL_TABLET | Freq: Three times a day (TID) | ORAL | Status: DC

## 2015-04-06 MED ORDER — IBUPROFEN 800 MG PO TABS
800.0000 mg | ORAL_TABLET | Freq: Once | ORAL | Status: AC
  Administered 2015-04-06: 800 mg via ORAL
  Filled 2015-04-06: qty 1

## 2015-04-06 MED ORDER — METHOCARBAMOL 500 MG PO TABS
500.0000 mg | ORAL_TABLET | Freq: Once | ORAL | Status: AC
  Administered 2015-04-06: 500 mg via ORAL
  Filled 2015-04-06: qty 1

## 2015-04-06 MED ORDER — METHOCARBAMOL 500 MG PO TABS: 500.0000 mg | ORAL_TABLET | Freq: Two times a day (BID) | ORAL | Status: DC

## 2015-04-06 MED ORDER — HYDROCODONE-ACETAMINOPHEN 5-325 MG PO TABS
1.0000 | ORAL_TABLET | Freq: Once | ORAL | Status: DC
  Filled 2015-04-06: qty 1
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
## 2015-04-06 NOTE — Discharge Instructions (Signed)
1. Medications: robaxin, ibuprofen, usual home medications °2. Treatment: rest, drink plenty of fluids, gentle stretching as discussed, alternate ice and heat °3. Follow Up: Please followup with your primary doctor in 3 days for discussion of your diagnoses and further evaluation after today's visit; if you do not have a primary care doctor use the resource guide provided to find one;  Return to the ER for worsening back pain, difficulty walking, loss of bowel or bladder control or other concerning symptoms ° ° ° ° °Cervical Sprain °A cervical sprain is an injury in the neck in which the strong, fibrous tissues (ligaments) that connect your neck bones stretch or tear. Cervical sprains can range from mild to severe. Severe cervical sprains can cause the neck vertebrae to be unstable. This can lead to damage of the spinal cord and can result in serious nervous system problems. The amount of time it takes for a cervical sprain to get better depends on the cause and extent of the injury. Most cervical sprains heal in 1 to 3 weeks. °CAUSES  °Severe cervical sprains may be caused by:  °· Contact sport injuries (such as from football, rugby, wrestling, hockey, auto racing, gymnastics, diving, martial arts, or boxing).   °· Motor vehicle collisions.   °· Whiplash injuries. This is an injury from a sudden forward and backward whipping movement of the head and neck.  °· Falls.   °Mild cervical sprains may be caused by:  °· Being in an awkward position, such as while cradling a telephone between your ear and shoulder.   °· Sitting in a chair that does not offer proper support.   °· Working at a poorly designed computer station.   °· Looking up or down for long periods of time.   °SYMPTOMS  °· Pain, soreness, stiffness, or a burning sensation in the front, back, or sides of the neck. This discomfort may develop immediately after the injury or slowly, 24 hours or more after the injury.   °· Pain or tenderness directly in the  middle of the back of the neck.   °· Shoulder or upper back pain.   °· Limited ability to move the neck.   °· Headache.   °· Dizziness.   °· Weakness, numbness, or tingling in the hands or arms.   °· Muscle spasms.   °· Difficulty swallowing or chewing.   °· Tenderness and swelling of the neck.   °DIAGNOSIS  °Most of the time your health care provider can diagnose a cervical sprain by taking your history and doing a physical exam. Your health care provider will ask about previous neck injuries and any known neck problems, such as arthritis in the neck. X-rays may be taken to find out if there are any other problems, such as with the bones of the neck. Other tests, such as a CT scan or MRI, may also be needed.  °TREATMENT  °Treatment depends on the severity of the cervical sprain. Mild sprains can be treated with rest, keeping the neck in place (immobilization), and pain medicines. Severe cervical sprains are immediately immobilized. Further treatment is done to help with pain, muscle spasms, and other symptoms and may include: °· Medicines, such as pain relievers, numbing medicines, or muscle relaxants.   °· Physical therapy. This may involve stretching exercises, strengthening exercises, and posture training. Exercises and improved posture can help stabilize the neck, strengthen muscles, and help stop symptoms from returning.   °HOME CARE INSTRUCTIONS  °· Put ice on the injured area.   °¨ Put ice in a plastic bag.   °¨ Place a towel between your skin and the   bag.   °¨ Leave the ice on for 15-20 minutes, 3-4 times a day.   °· If your injury was severe, you may have been given a cervical collar to wear. A cervical collar is a two-piece collar designed to keep your neck from moving while it heals. °¨ Do not remove the collar unless instructed by your health care provider. °¨ If you have long hair, keep it outside of the collar. °¨ Ask your health care provider before making any adjustments to your collar. Minor  adjustments may be required over time to improve comfort and reduce pressure on your chin or on the back of your head. °¨ If you are allowed to remove the collar for cleaning or bathing, follow your health care provider's instructions on how to do so safely. °¨ Keep your collar clean by wiping it with mild soap and water and drying it completely. If the collar you have been given includes removable pads, remove them every 1-2 days and hand wash them with soap and water. Allow them to air dry. They should be completely dry before you wear them in the collar. °¨ If you are allowed to remove the collar for cleaning and bathing, wash and dry the skin of your neck. Check your skin for irritation or sores. If you see any, tell your health care provider. °¨ Do not drive while wearing the collar.   °· Only take over-the-counter or prescription medicines for pain, discomfort, or fever as directed by your health care provider.   °· Keep all follow-up appointments as directed by your health care provider.   °· Keep all physical therapy appointments as directed by your health care provider.   °· Make any needed adjustments to your workstation to promote good posture.   °· Avoid positions and activities that make your symptoms worse.   °· Warm up and stretch before being active to help prevent problems.   °SEEK MEDICAL CARE IF:  °· Your pain is not controlled with medicine.   °· You are unable to decrease your pain medicine over time as planned.   °· Your activity level is not improving as expected.   °SEEK IMMEDIATE MEDICAL CARE IF:  °· You develop any bleeding. °· You develop stomach upset. °· You have signs of an allergic reaction to your medicine.   °· Your symptoms get worse.   °· You develop new, unexplained symptoms.   °· You have numbness, tingling, weakness, or paralysis in any part of your body.   °MAKE SURE YOU:  °· Understand these instructions. °· Will watch your condition. °· Will get help right away if you are not  doing well or get worse. °  °This information is not intended to replace advice given to you by your health care provider. Make sure you discuss any questions you have with your health care provider. °  °Document Released: 01/06/2007 Document Revised: 03/16/2013 Document Reviewed: 09/16/2012 °Elsevier Interactive Patient Education ©2016 [employer]vier Inc[employer] ° ° ° °Emergency Department Resource Guide °1) Find a Doctor and Pay Out of Pocket °Although you won't have to find out who is covered by your insurance plan, it is a good idea to ask around and get recommendations. You will then need to call the office and see if the doctor you have chosen will accept you as a new patient and what types of options they offer for patients who are self-pay. Some doctors offer discounts or will set up payment plans for their patients who do not have insurance, but you will need to ask so you aren't surprised when you get   to your appointment. ° °2) Contact Your Local Health Department °Not all health departments have doctors that can see patients for sick visits, but many do, so it is worth a call to see if yours does. If you don't know where your local health department is, you can check in your phone book. The CDC also has a tool to help you locate your state's health department, and many state websites also have listings of all of their local health departments. ° °3) Find a Walk-in Clinic °If your illness is not likely to be very severe or complicated, you may want to try a walk in clinic. These are popping up all over the country in pharmacies, drugstores, and shopping centers. They're usually staffed by nurse practitioners or physician assistants that have been trained to treat common illnesses and complaints. They're usually fairly quick and inexpensive. However, if you have serious medical issues or chronic medical problems, these are probably not your best option. ° °No Primary Care Doctor: °- Call [facility] at  832-8000 -  they can help you locate a primary care doctor that  accepts your insurance, provides certain services, etc. °- Physician Referral Service- 1-800-533-3463 ° °Chronic Pain Problems: °Organization         Address  Phone   Notes  °[facility]  (336) 297-2271 Patients need to be referred by their primary care doctor.  ° °Medication Assistance: °Organization         Address  Phone   Notes  °[place] [facility] [address] °[place], [place] [address] (336) 641-8030 --Must be a resident of [place] °-- Must have NO insurance coverage whatsoever (no Medicaid/ Medicare, etc.) °-- The pt. MUST have a primary care doctor that directs their care regularly and follows them in the community °  °[facility]  (866) 331-1348   °[employer]  (888) 892-1162   ° °Agencies that provide inexpensive medical care: °Organization         Address  Phone   Notes  °[facility]  (336) 832-8035   °[facility]    (336) 832-7272   °[facility] [address] °[place], [place] [address] (336) 832-4777   °[facility] [address], °[place] (336) 271-4999   °[facility]    (336) 373-0678   °[facility]    (336) 272-1050   °[facility] ° [address], [place] Phone:  (336) 832-4444, Fax:  (336) 832-4440 Hours of Operation:  9 am - 6 pm, M-F.  Also accepts Medicaid/Medicare and self-pay.  °[facility] ° [address], [place] Phone: (336) [phone], Fax: (336) [phone]. Hours of Operation:  8:30 am - 5:30 pm, M-F.  Also accepts Medicaid and self-pay.  °[facility] [place] [address], [place] Phone: (336) 878-6027   °[facility] [address], [place], [place] (336)723-1848, Ext. 123 Mondays & Thursdays: 7-9 AM.  First 15 patients are seen on a first come, first serve basis. °  ° °Medicaid-accepting  [place] Providers: ° °Organization         Address  Phone   Notes  °[facility] [address],  (336) [phone] Also accepts self-pay patients.  °[facility] [address],  ° (336) 856-9996   °[facility] [address], [place] (336) 288-8857   °[facility] [address] [place] [address], [place] (336) 299-7000   °Veita Bland [address], [place]  ° (336) 373-1557 Only accepts [employer] Access Medicaid patients after they have their name applied to their card.  ° °Self-Pay (no insurance) in [place]: ° °Organization         Address  Phone   Notes  °Sickle Cell Patients, [facility] [address], [place] (336) 832-1970   °[facility] [address], [place] (336) 832-4400   °[facility] ° [address], [place] (336) 992-4800   °[facility]/Dr. Osei-Bonsu ° [address] [place] [address], [place] or [address], [place] (336) [phone] Phone number for both [place] and [place] locations is the same.  °[facility] [address], [place] (336) 299-0000   °[facility] [address] [place] [address], [place] or [address] (336) 852-7530 °(336) 878-2260   °[facility] [address], [place] (336) 350-1642, phone; (336) 294-5005, fax Sees patients 1st and 3rd Saturday of every month.  Must not qualify for public or private insurance (i.e. Medicaid, Medicare, [place] Health Choice, Veterans' Benefits) • Household income should be no more than 200% of the poverty level •The clinic cannot treat you if you are pregnant or think you are pregnant • Sexually transmitted diseases are not treated at the clinic.  ° ° °Dental Care: °Organization         Address  Phone  Notes  °[place] [facility] [address], [place] (336) 641-6152 Accepts children up to age 21 who are enrolled in Medicaid or [place] Health Choice; pregnant women with a Medicaid card; and children who have applied for Medicaid or [place] Health Choice, but were declined, whose parents can pay a reduced fee at time of service.  °[facility] [place]  [address], [place] (336) 641-7733 Accepts children up to age 21 who are enrolled in Medicaid or [place] Health Choice; pregnant women with a Medicaid card; and children who have applied for Medicaid or [place] Health Choice, but were declined, whose parents can pay a reduced fee at time of service.  °[facility] ° [address], [place] (336) 641-4533 Patients are seen by appointment only. Walk-ins are not accepted. [facility] will see patients 18 years of age and older. °Monday - Tuesday (8am-5pm) °Most Wednesdays (8:30-5pm) °$30 per visit, cash only  °[facility] ° [address], [place] (336) 641-4533 Patients are seen by appointment only. Walk-ins are not accepted. [facility] will see patients 18 years of age and older. °One Wednesday Evening (Monthly: Volunteer Based).  $30 per visit, cash only  °[facility]  (919) 537-3737 for adults; Children under age 4, call Graduate Pediatric Dentistry at ([phone]) [phone]. Children aged 4-14, please call ([phone]) [phone] to request a pediatric application. ° Dental services are provided in all areas of dental care including fillings, crowns and bridges, complete and partial dentures, implants, gum treatment, root canals, and extractions. Preventive care is also provided. Treatment is provided to both adults and children. °Patients are selected via a lottery and there is often a waiting list. °  °[facility] [address], °[place] ° (336) 763-8833 [email or web address] °  °[facility] [address], [place], [place] (336)723-1848, Ext. 123 Second and Fourth Thursday of each month, opens at 6:30 AM; Clinic ends at 9 AM.  Patients are seen on a first-come first-served basis, and a limited number are seen during each clinic.  ° °[facility] ° [address], [place], [place] (336) 723-7904   Eligibility Requirements °You must have lived in [place], [place], or [place] counties for at least the last three months. °  You cannot be eligible for state or federal sponsored healthcare insurance, including Veterans Administration, Medicaid, or Medicare. °  You generally cannot be eligible for healthcare insurance through your employer.  °  How to apply: °Eligibility screenings are held every Tuesday and Wednesday afternoon from 1:00 pm until 4:00 pm. You do not need an appointment for the interview!  °[facility] [address], [place], [place] 336-631-2330   °[facility]  336-342-8273   °[facility]  336-703-3100   ° [facility]  336-570-6415   ° °Behavioral Health Resources in the Community: °Intensive Outpatient Programs °Organization         Address  Phone  Notes  °[facility] [address], [place], [place] 336-878-6098   °[facility] [address], [place], [place] 336-832-9800   °[facility] [address], [place], [place] ° 336-882-2125   °[facility] [address],  °[place], [place] 1-800-853-5163 or 336-641-4981   °Substance Abuse Resources °Organization         Address  Phone  Notes  °[facility]  336-882-2125   °[facility]  336-784-9470   °[facility]  336-285-9073   °[facility]  336-845-3988   °Residential & Outpatient Substance Abuse Program  1-800-659-3381   °Psychological Services °Organization         Address  Phone  Notes  °[facility]   336- 832-9600   °[facility]  336- 378-7881   °[facility] [address], [place] 1-800-853-5163 or 336-641-4981   ° °Mobile Crisis Teams °Organization         Address  Phone  Notes  °[facility], Mobile Crisis Care Unit  1-877-626-1772   °Assertive °[facility] ° [address] [place], [place] 336-834-9664   °Sharon DeEsch [address] °[place] 336-554-5454   ° °Self-Help/Support Groups °Organization         Address  Phone             Notes  °[facility] - variety of support groups  336- 373-1402 Call for more information  °Narcotics Anonymous (NA), [facility] [address], °[place]  2 meetings at this location  ° °Residential Treatment Programs °Organization         Address  Phone  Notes  °[facility] [address],    °[place]  1-866-801-8205   °[facility] ° [address], [place], [place] 704-293-8524   °[facility] [address], [place] 336-845-3988 Admissions: 8am-3pm M-F  °[facility] [address]    °[place],  336-841-1104   °[facility] [address], [place],  336-379-7146   °[facility] [address]  °,    336-285-9073   °[facility] - Intensive Outpatient [address], [place], [place] 336-852-3033   °[facility] [address]  °[place], [place] 1-877-615-2722 or 336-784-9470   °[facility] [address] [place], [place] 336-227-7417 Accepts Medicaid  °[facility] [address]  °[place] 1-800-659-3381 Substance Abuse/Addiction Treatment  ° °[place] Behavioral Health Resources °Organization         Address  Phone  Notes  °[facility]  (888) 581-9988   °Julie Brannon, PhD [address] [place], [place]   (336) 349-5553 or  (336) 951-0000   °[facility]   [address] °[place], [place] (336) 349-4454   °[facility] [address], [place], [place] (336) 342-8316 Insurance/Medicaid/sponsorship through [facility]  °[facility] [address]                                    [place], [place] (336) 342-8316 Therapy/tele-psych/case  °[facility] [address]  ° [place], [place] (336) 349-2233    °Dr. Arfeen  (336) 349-4544   °[facility] [place]  [employer] [place] [facility] 1) [address],  °2) [address], [place] °3)  [address], [place] (336) 349-3220 °(336) 342-7768 ° °(336) 342-8140   °[place] [facility] (336) 342-1394 or (336) 342-3537 (After Hours)    ° ° ° °

## 2015-06-07 ENCOUNTER — Emergency Department (HOSPITAL_COMMUNITY)
Admission: EM | Admit: 2015-06-07 | Discharge: 2015-06-08 | Disposition: A | Discharge status: Home / Self Care | Payer: Self-pay | Attending: Emergency Medicine | Admitting: Emergency Medicine

## 2015-06-07 DIAGNOSIS — R1013 Epigastric pain: Secondary | ICD-10-CM | POA: Insufficient documentation

## 2015-06-07 DIAGNOSIS — R1011 Right upper quadrant pain: Secondary | ICD-10-CM | POA: Insufficient documentation

## 2015-06-07 DIAGNOSIS — Z791 Long term (current) use of non-steroidal anti-inflammatories (NSAID): Secondary | ICD-10-CM | POA: Insufficient documentation

## 2015-06-07 NOTE — ED Notes (Signed)
Patient presents for lower abdominal pain x1 day, denies N/V/D, fever/chills, urinary symptoms. Rates pain 6/10.

## 2015-06-08 ENCOUNTER — Encounter (HOSPITAL_COMMUNITY): Payer: Self-pay | Admitting: Emergency Medicine

## 2015-06-08 ENCOUNTER — Emergency Department (HOSPITAL_COMMUNITY): Payer: Self-pay

## 2015-06-08 LAB — URINALYSIS, ROUTINE W REFLEX MICROSCOPIC
Bilirubin Urine: NEGATIVE
Glucose, UA: NEGATIVE mg/dL
Hgb urine dipstick: NEGATIVE
Ketones, ur: NEGATIVE mg/dL
LEUKOCYTES UA: NEGATIVE
NITRITE: NEGATIVE
PROTEIN: NEGATIVE mg/dL
SPECIFIC GRAVITY, URINE: 1.036 — AB (ref 1.005–1.030)
pH: 6 (ref 5.0–8.0)

## 2015-06-08 LAB — COMPREHENSIVE METABOLIC PANEL
ALK PHOS: 66 U/L (ref 38–126)
ALT: 17 U/L (ref 17–63)
ANION GAP: 7 (ref 5–15)
AST: 21 U/L (ref 15–41)
Albumin: 4.1 g/dL (ref 3.5–5.0)
BUN: 18 mg/dL (ref 6–20)
CALCIUM: 8.9 mg/dL (ref 8.9–10.3)
CO2: 24 mmol/L (ref 22–32)
Chloride: 111 mmol/L (ref 101–111)
Creatinine, Ser: 0.78 mg/dL (ref 0.61–1.24)
Glucose, Bld: 108 mg/dL — ABNORMAL HIGH (ref 65–99)
Potassium: 4 mmol/L (ref 3.5–5.1)
SODIUM: 142 mmol/L (ref 135–145)
TOTAL PROTEIN: 7.3 g/dL (ref 6.5–8.1)
Total Bilirubin: 0.9 mg/dL (ref 0.3–1.2)

## 2015-06-08 LAB — CBC
HCT: 41.8 % (ref 39.0–52.0)
HEMOGLOBIN: 14.8 g/dL (ref 13.0–17.0)
MCH: 31.6 pg (ref 26.0–34.0)
MCHC: 35.4 g/dL (ref 30.0–36.0)
MCV: 89.1 fL (ref 78.0–100.0)
PLATELETS: 258 10*3/uL (ref 150–400)
RBC: 4.69 MIL/uL (ref 4.22–5.81)
RDW: 12.1 % (ref 11.5–15.5)
WBC: 6.6 10*3/uL (ref 4.0–10.5)

## 2015-06-08 LAB — LIPASE, BLOOD: Lipase: 28 U/L (ref 11–51)

## 2015-06-08 MED ORDER — GI COCKTAIL ~~LOC~~
30.0000 mL | Freq: Once | ORAL | Status: AC
Start: 2015-06-08 — End: 2015-06-08
  Administered 2015-06-08: 30 mL via ORAL
  Filled 2015-06-08: qty 30

## 2015-06-08 MED ORDER — SUCRALFATE 1 G PO TABS: 1.0000 g | ORAL_TABLET | Freq: Three times a day (TID) | ORAL | Status: DC

## 2015-06-08 MED ORDER — FAMOTIDINE 20 MG PO TABS: 20.0000 mg | ORAL_TABLET | Freq: Two times a day (BID) | ORAL | Status: DC

## 2015-06-08 NOTE — ED Provider Notes (Signed)
CSN: [id number]     Arrival date & time 06/07/15  2355 History   First MD Initiated Contact with Patient 06/08/15 0131     Chief Complaint  Patient presents with  . Abdominal Pain     (Consider location/radiation/quality/duration/timing/severity/associated sxs/prior Treatment) HPI Glenn Kelly is a 45 y.o. male presents emergency department complaining of epigastric abdominal pain, intermittently for 2 days. Patient states that symptoms come and go. He reports that pain is sometimes worse after eating. Patient denies any nausea or vomiting. No diarrhea. States he hasn't had a bowel movement in several days which is unusual for him. He denies any blood in his stool or his urine. He states he has been eating and drinking well. He describes pain as sharp. Nothing is making it better or worse. He tried to take ibuprofen and Tylenol which she states did not help. He denies similar pain in the past. No prior abdominal surgeries. No fever or chills. He reports pain radiating from the epigastric area to the chest. States he is currently pain-free, however he states "just prior to walking and pain was there."  History reviewed. No pertinent past medical history. Past Surgical History  Procedure Laterality Date  . Congenital heart     . Eye surgery     No family history on file. Social History  Substance Use Topics  . Smoking status: Never Smoker   . Smokeless tobacco: Never Used  . Alcohol Use: No    Review of Systems  Constitutional: Negative for fever and chills.  Respiratory: Negative for cough, chest tightness and shortness of breath.   Cardiovascular: Negative for chest pain, palpitations and leg swelling.  Gastrointestinal: Positive for abdominal pain. Negative for nausea, vomiting, diarrhea and abdominal distention.  Genitourinary: Negative for dysuria, urgency, frequency and hematuria.  Musculoskeletal: Negative for myalgias, arthralgias, neck pain and neck stiffness.  Skin:  Negative for rash.  Allergic/Immunologic: Negative for immunocompromised state.  Neurological: Negative for dizziness, weakness, light-headedness, numbness and headaches.  All other systems reviewed and are negative.     Allergies  Review of patient's allergies indicates no known allergies.  Home Medications   Prior to Admission medications   Medication Sig Start Date End Date Taking? Authorizing Provider  acetaminophen (TYLENOL) 500 MG tablet Take 1,000 mg by mouth every 6 (six) hours as needed for mild pain.   Yes Historical Provider, MD  naproxen sodium (ANAPROX) 220 MG tablet Take 220 mg by mouth 2 (two) times daily with a meal.   Yes Historical Provider, MD  ibuprofen (ADVIL,MOTRIN) 800 MG tablet Take 1 tablet (800 mg total) by mouth 3 (three) times daily. Patient not taking: Reported on 06/08/2015 04/06/15   Dahlia Client Muthersbaugh, PA-C  methocarbamol (ROBAXIN) 500 MG tablet Take 1 tablet (500 mg total) by mouth 2 (two) times daily. Patient not taking: Reported on 06/08/2015 04/06/15   Dahlia Client Muthersbaugh, PA-C   BP 116/83 mmHg  Pulse 78  Temp(Src) 97.9 F (36.6 C) (Oral)  Resp 18  Ht  (1.702 m)  Wt 77.111 kg  BMI 26.62 kg/m2  SpO2 98% Physical Exam  Constitutional: He appears well-developed and well-nourished. No distress.  HENT:  Head: Normocephalic and atraumatic.  Eyes: Conjunctivae are normal.  Neck: Neck supple.  Cardiovascular: Normal rate, regular rhythm and normal heart sounds.   Pulmonary/Chest: Effort normal. No respiratory distress. He has no wheezes. He has no rales.  Abdominal: Soft. Bowel sounds are normal. He exhibits no distension. There is tenderness. There is no  rebound and no guarding.  The right upper quadrant tenderness  Musculoskeletal: He exhibits no edema.  Neurological: He is alert.  Skin: Skin is warm and dry.  Nursing note and vitals reviewed.   ED Course  Procedures (including critical care time) Labs Review Labs Reviewed   COMPREHENSIVE METABOLIC PANEL - Abnormal; Notable for the following:    Glucose, Bld 108 (*)    All other components within normal limits  URINALYSIS, ROUTINE W REFLEX MICROSCOPIC (NOT AT [facility]) - Abnormal; Notable for the following:    Specific Gravity, Urine 1.036 (*)    All other components within normal limits  LIPASE, BLOOD  CBC    Imaging Review [place] Abdomen Complete  06/08/2015  CLINICAL DATA:  Abdominal pain for 2 days EXAM: ABDOMEN ULTRASOUND COMPLETE COMPARISON:  None. FINDINGS: Gallbladder: No gallstones or wall thickening visualized. No sonographic Murphy sign noted by sonographer. Common bile duct: Diameter: 4 mm Liver: Lobulated but overall simple 18 mm cyst in the central liver. No evidence of solid mass. Within normal limits in parenchymal echogenicity. IVC: No abnormality visualized. Pancreas: Essentially obscured. Spleen: Size and appearance within normal limits. Right Kidney: Length: 11 cm. Echogenicity within normal limits. No mass or hydronephrosis visualized. Left Kidney: Length: 11 cm. Echogenicity within normal limits. No mass or hydronephrosis visualized. Abdominal aorta: No aneurysm visualized. IMPRESSION: 1. No explanation for abdominal pain. 2. Obscured pancreas. Electronically Signed   By: Marnee SpringJonathon  Watts M.D.   On: 06/08/2015 03:01   I have personally reviewed and evaluated these images and lab results as part of my medical decision-making.   EKG Interpretation None      MDM   Final diagnoses:  Epigastric pain   Patient with intermittent right upper quadrant epigastric tenderness. Abdomen is otherwise soft, no guarding or rebound tenderness, doubt surgical abdomen. He is not a drinker, does not take ibuprofen frequently, he is not a smoker, he has low risk for gastric ulcers, however still in differential. Question GERD versus gallbladder disease. Check labs and get ultrasound of the abdomen. Will try GI cocktail for pain.  3:18 AM Patient is pain-free.  Ultrasound is negative. Blood work all unremarkable. No evidence of peritonitis. Pain is mainly epigastric. Most likely acid reflux. We will try Carafate and Pepcid. Will discharge home with outpatient follow-up. Return precautions discussed especially if pain is worsening.  Filed Vitals:   06/08/15 0003  BP: 116/83  Pulse: 78  Temp: 97.9 F (36.6 C)  TempSrc: Oral  Resp: 18  Height: 5\' 7"  (1.702 m)  Weight: 16.10977.111 kg  SpO2: 98%     Jaynie Crumbleatyana Keiland Pickering, PA-C 06/08/15 0319  Paula LibraJohn Molpus, MD 06/08/15 [id number]

## 2015-06-08 NOTE — ED Notes (Signed)
No answer from waiting room.

## 2015-06-08 NOTE — ED Notes (Signed)
Ultrasound at bedside

## 2015-06-08 NOTE — Discharge Instructions (Signed)
pepcid as prescribed daily. carafate as needed. Follow up with primary care doctor. Return if worsening or not improving.   Gastroesophageal Reflux Disease, Adult Normally, food travels down the esophagus and stays in the stomach to be digested. However, when a person has gastroesophageal reflux disease (GERD), food and stomach acid move back up into the esophagus. When this happens, the esophagus becomes sore and inflamed. Over time, GERD can create small holes (ulcers) in the lining of the esophagus.  CAUSES This condition is caused by a problem with the muscle between the esophagus and the stomach (lower esophageal sphincter, or LES). Normally, the LES muscle closes after food passes through the esophagus to the stomach. When the LES is weakened or abnormal, it does not close properly, and that allows food and stomach acid to go back up into the esophagus. The LES can be weakened by certain dietary substances, medicines, and medical conditions, including:  Tobacco use.  Pregnancy.  Having a hiatal hernia.  Heavy alcohol use.  Certain foods and beverages, such as coffee, chocolate, onions, and peppermint. RISK FACTORS This condition is more likely to develop in:  People who have an increased body weight.  People who have connective tissue disorders.  People who use NSAID medicines. SYMPTOMS Symptoms of this condition include:  Heartburn.  Difficult or painful swallowing.  The feeling of having a lump in the throat.  Abitter taste in the mouth.  Bad breath.  Having a large amount of saliva.  Having an upset or bloated stomach.  Belching.  Chest pain.  Shortness of breath or wheezing.  Ongoing (chronic) cough or a night-time cough.  Wearing away of tooth enamel.  Weight loss. Different conditions can cause chest pain. Make sure to see your health care provider if you experience chest pain. DIAGNOSIS Your health care provider will take a medical history and  perform a physical exam. To determine if you have mild or severe GERD, your health care provider may also monitor how you respond to treatment. You may also have other tests, including:  An endoscopy toexamine your stomach and esophagus with a small camera.  A test thatmeasures the acidity level in your esophagus.  A test thatmeasures how much pressure is on your esophagus.  A barium swallow or modified barium swallow to show the shape, size, and functioning of your esophagus. TREATMENT The goal of treatment is to help relieve your symptoms and to prevent complications. Treatment for this condition may vary depending on how severe your symptoms are. Your health care provider may recommend:  Changes to your diet.  Medicine.  Surgery. HOME CARE INSTRUCTIONS Diet  Follow a diet as recommended by your health care provider. This may involve avoiding foods and drinks such as:  Coffee and tea (with or without caffeine).  Drinks that containalcohol.  Energy drinks and sports drinks.  Carbonated drinks or sodas.  Chocolate and cocoa.  Peppermint and mint flavorings.  Garlic and onions.  Horseradish.  Spicy and acidic foods, including peppers, chili powder, curry powder, vinegar, hot sauces, and barbecue sauce.  Citrus fruit juices and citrus fruits, such as oranges, lemons, and limes.  Tomato-based foods, such as red sauce, chili, salsa, and pizza with red sauce.  Fried and fatty foods, such as donuts, french fries, potato chips, and high-fat dressings.  High-fat meats, such as hot dogs and fatty cuts of red and white meats, such as rib eye steak, sausage, ham, and bacon.  High-fat dairy items, such as whole milk, butter,  and cream cheese.  Eat small, frequent meals instead of large meals.  Avoid drinking large amounts of liquid with your meals.  Avoid eating meals during the 2-3 hours before bedtime.  Avoid lying down right after you eat.  Do not exercise right  after you eat. General Instructions  Pay attention to any changes in your symptoms.  Take over-the-counter and prescription medicines only as told by your health care provider. Do not take aspirin, ibuprofen, or other NSAIDs unless your health care provider told you to do so.  Do not use any tobacco products, including cigarettes, chewing tobacco, and e-cigarettes. If you need help quitting, ask your health care provider.  Wear loose-fitting clothing. Do not wear anything tight around your waist that causes pressure on your abdomen.  Raise (elevate) the head of your bed 6 inches (15cm).  Try to reduce your stress, such as with yoga or meditation. If you need help reducing stress, ask your health care provider.  If you are overweight, reduce your weight to an amount that is healthy for you. Ask your health care provider for guidance about a safe weight loss goal.  Keep all follow-up visits as told by your health care provider. This is important. SEEK MEDICAL CARE IF:  You have new symptoms.  You have unexplained weight loss.  You have difficulty swallowing, or it hurts to swallow.  You have wheezing or a persistent cough.  Your symptoms do not improve with treatment.  You have a hoarse voice. SEEK IMMEDIATE MEDICAL CARE IF:  You have pain in your arms, neck, jaw, teeth, or back.  You feel sweaty, dizzy, or light-headed.  You have chest pain or shortness of breath.  You vomit and your vomit looks like blood or coffee grounds.  You faint.  Your stool is bloody or black.  You cannot swallow, drink, or eat.   This information is not intended to replace advice given to you by your health care provider. Make sure you discuss any questions you have with your health care provider.   Document Released: 12/19/2004 Document Revised: 11/30/2014 Document Reviewed: 07/06/2014 Elsevier Interactive Patient Education [employer] Inc[employer]

## 2016-02-15 ENCOUNTER — Encounter (HOSPITAL_COMMUNITY): Payer: Self-pay | Admitting: Emergency Medicine

## 2016-02-15 ENCOUNTER — Emergency Department (HOSPITAL_COMMUNITY)
Admission: EM | Admit: 2016-02-15 | Discharge: 2016-02-15 | Disposition: A | Discharge status: Home / Self Care | Payer: Self-pay | Attending: Emergency Medicine | Admitting: Emergency Medicine

## 2016-02-15 DIAGNOSIS — H6123 Impacted cerumen, bilateral: Secondary | ICD-10-CM | POA: Insufficient documentation

## 2016-02-15 DIAGNOSIS — Y999 Unspecified external cause status: Secondary | ICD-10-CM | POA: Insufficient documentation

## 2016-02-15 DIAGNOSIS — Y939 Activity, unspecified: Secondary | ICD-10-CM | POA: Insufficient documentation

## 2016-02-15 DIAGNOSIS — Y929 Unspecified place or not applicable: Secondary | ICD-10-CM | POA: Insufficient documentation

## 2016-02-15 DIAGNOSIS — W57XXXA Bitten or stung by nonvenomous insect and other nonvenomous arthropods, initial encounter: Secondary | ICD-10-CM | POA: Insufficient documentation

## 2016-02-15 DIAGNOSIS — S40869A Insect bite (nonvenomous) of unspecified upper arm, initial encounter: Secondary | ICD-10-CM | POA: Insufficient documentation

## 2016-02-15 MED ORDER — PERMETHRIN 5 % EX CREA: TOPICAL_CREAM | CUTANEOUS | 1 refills | Status: DC

## 2016-02-15 NOTE — ED Triage Notes (Signed)
Pt c/o "rash" on arms and neck. Pt has multiple raised red spots on arms. Pt c/o itchiness. Pt sts they could be bed bugs. Pt also c/o L ear fullness x a couple of weeks. Pt sts it's a little harder to hear out of the L ear. A&Ox4 and ambulatory.

## 2016-02-15 NOTE — ED Provider Notes (Signed)
[facility]-EMERGENCY DEPT Provider Note   CSN: [id number] Arrival date & time: 02/15/16  1113     History   Chief Complaint Chief Complaint  Patient presents with  . Rash  . Ear Fullness    HPI Glenn Kelly is a 45 y.o. male.  Patient presents to the ED with a chief complaint of bug bites.  He states that he noticed bites on his arms and neck yesterday.  States that they have increased in sized and in severity of the itching.  He states that he has been living in a hotel.  He is concerned about bed bugs.  He also complains of ear fullness.  He denies any fevers or chills.  He states that he has tried over the counter ear drops.  He denies any other associated symptoms.   The history is provided by the patient. No language interpreter was used.    History reviewed. No pertinent past medical history.  There are no active problems to display for this patient.   Past Surgical History:  Procedure Laterality Date  . congenital heart     . EYE SURGERY         Home Medications    Prior to Admission medications   Not on File    Family History No family history on file.  Social History Social History  Substance Use Topics  . Smoking status: Never Smoker  . Smokeless tobacco: Never Used  . Alcohol use No     Allergies   Patient has no known allergies.   Review of Systems Review of Systems  HENT: Positive for hearing loss.   Skin: Positive for rash.  All other systems reviewed and are negative.    Physical Exam Updated Vital Signs BP 132/80 (BP Location: Right Arm)   Pulse 86   Temp 98.2 F (36.8 C) (Oral)   Resp 16   SpO2 96%   Physical Exam  Constitutional: He is oriented to person, place, and time. No distress.  HENT:  Head: Normocephalic and atraumatic.  Bilateral cerumen impaction  Eyes: Conjunctivae and EOM are normal. Pupils are equal, round, and reactive to light.  Neck: No tracheal deviation present.  Cardiovascular: Normal rate.     Pulmonary/Chest: Effort normal. No respiratory distress.  Abdominal: Soft.  Musculoskeletal: Normal range of motion.  Neurological: He is alert and oriented to person, place, and time.  Skin: Skin is warm and dry. He is not diaphoretic.  Scattered bites on upper extremities without evidence of abscess or cellulitis  Psychiatric: Judgment normal.  Nursing note and vitals reviewed.    ED Treatments / Results  Labs (all labs ordered are listed, but only abnormal results are displayed) Labs Reviewed - No data to display  EKG  EKG Interpretation None       Radiology No results found.  Procedures Procedures (including critical care time)  Medications Ordered in ED Medications - No data to display   Initial Impression / Assessment and Plan / ED Course  I have reviewed the triage vital signs and the nursing notes.  Pertinent labs & imaging results that were available during my care of the patient were reviewed by me and considered in my medical decision making (see chart for details).  Clinical Course     Patient with bug bites on upper extremities.  Likely bed bugs, possibly scabies.  Will treat with permethrin.  Also has bilateral cerumen impaction.  Recommend OTC earwax drops and flushing ears at home.  Final Clinical Impressions(s) / ED Diagnoses   Final diagnoses:  Bilateral impacted cerumen  Insect bite, initial encounter    New Prescriptions New Prescriptions   PERMETHRIN (ELIMITE) 5 % CREAM    Apply to entire body other than face - let sit for 14 hours then wash off, may repeat in 1 week if still having symptoms     Roxy HorsemanRobert Dorothe Elmore, PA-C 02/15/16 1208    Mancel BaleElliott Wentz, MD 02/16/16 [phone]

## 2017-02-06 IMAGING — US US ABDOMEN COMPLETE
1 series · 14 of 25 positions shown · non-contrast
Comparison: None.

CLINICAL DATA: Abdominal pain for 2 days

EXAM:
ABDOMEN ULTRASOUND COMPLETE

[Series 1: us abdomen complete · 0.26mm/px · 14 of 88 slices shown]
[im 1/88]
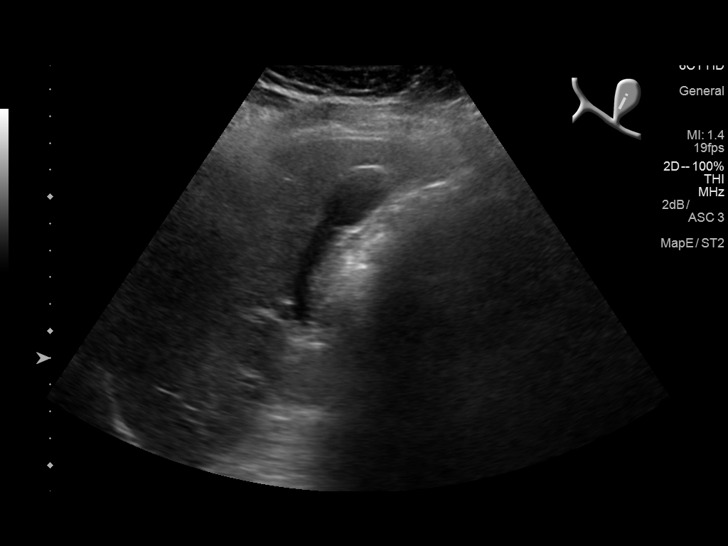
[im 8/88]
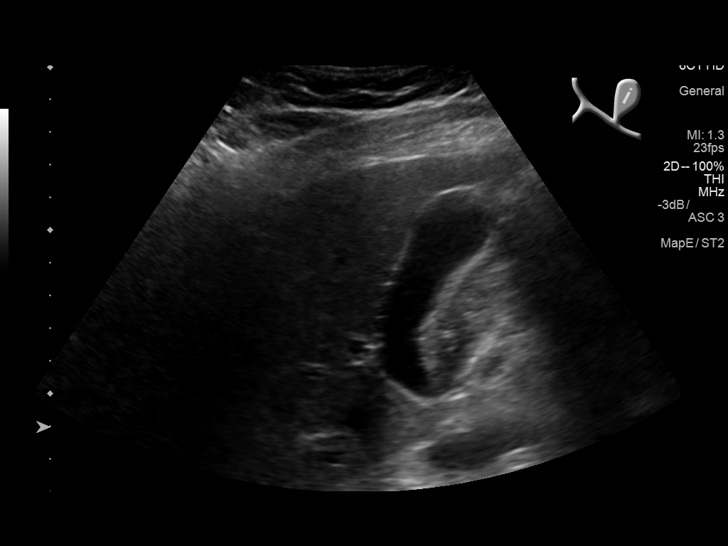
[im 15/88]
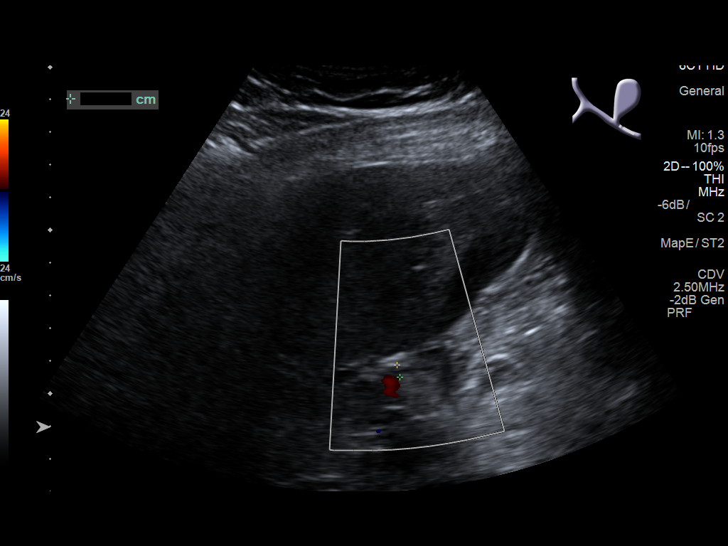
[im 22/88]
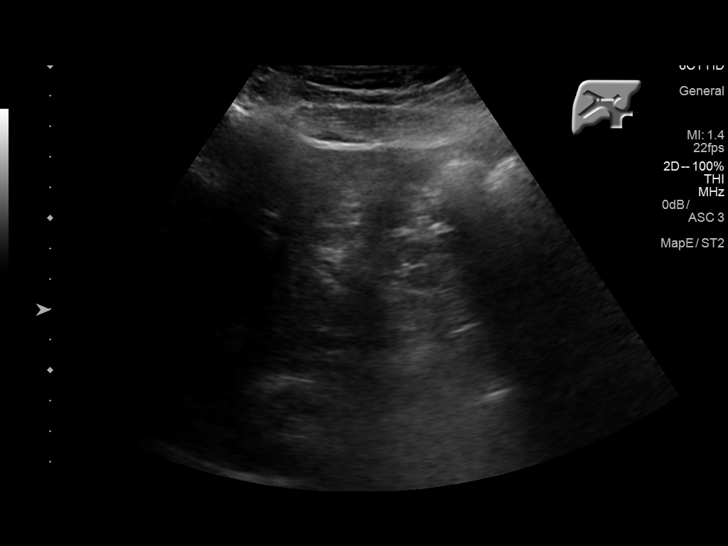
[im 30/88]
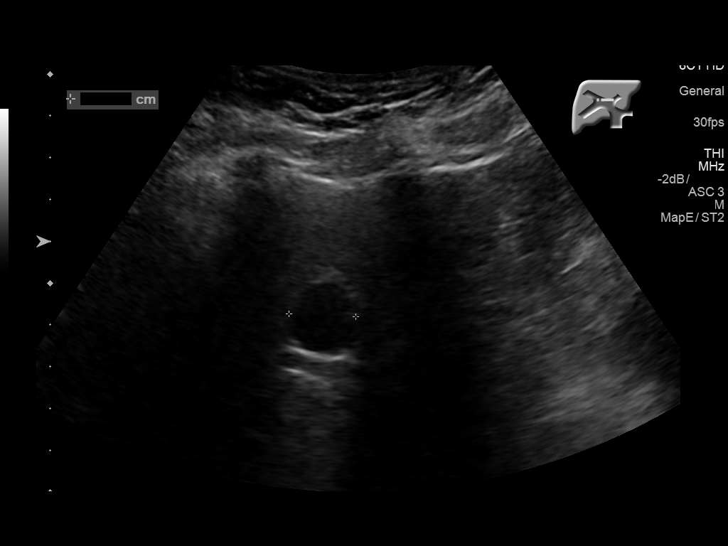
[im 33/88]
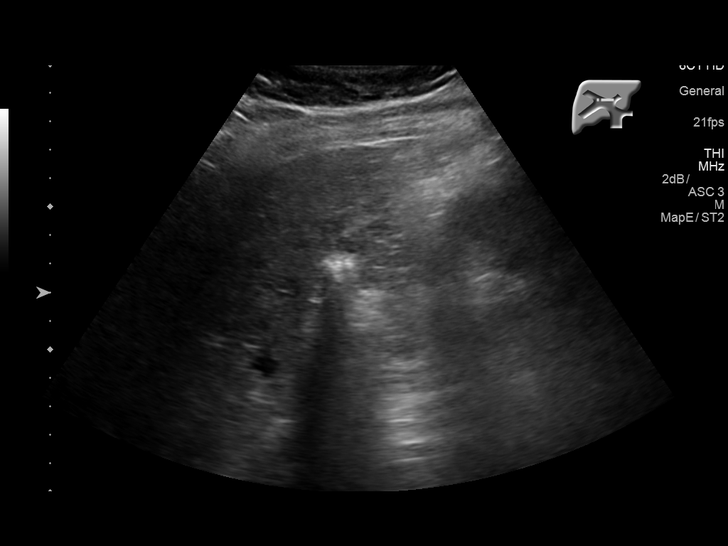
[im 40/88]
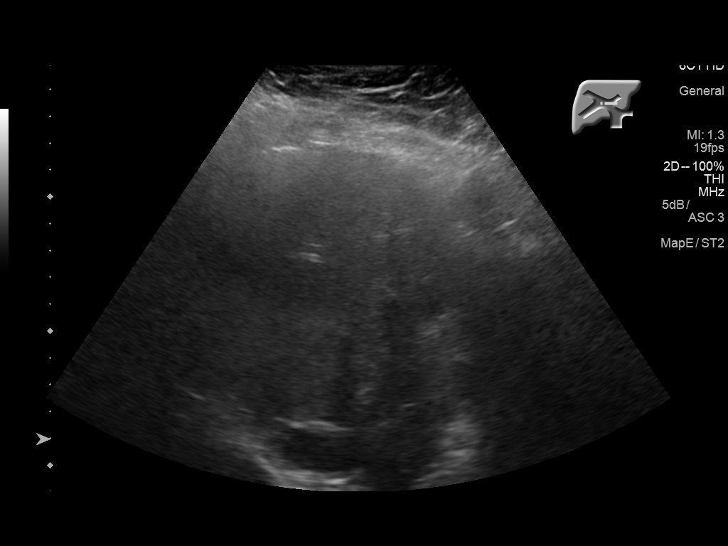
[im 48/88]
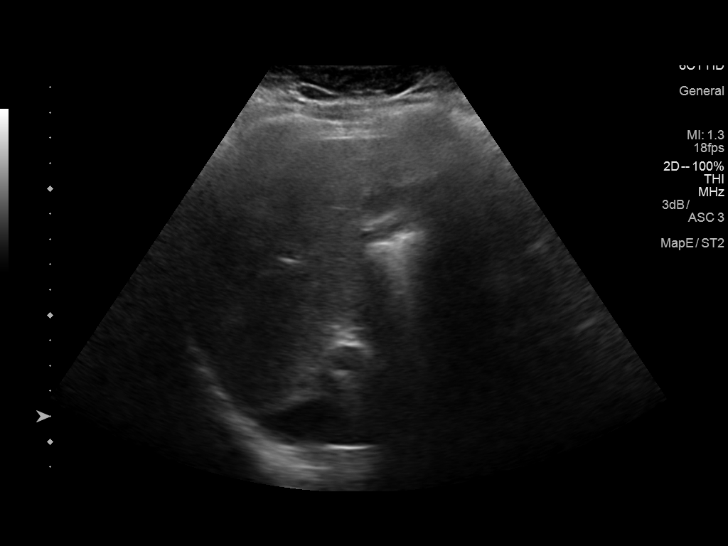
[im 55/88]
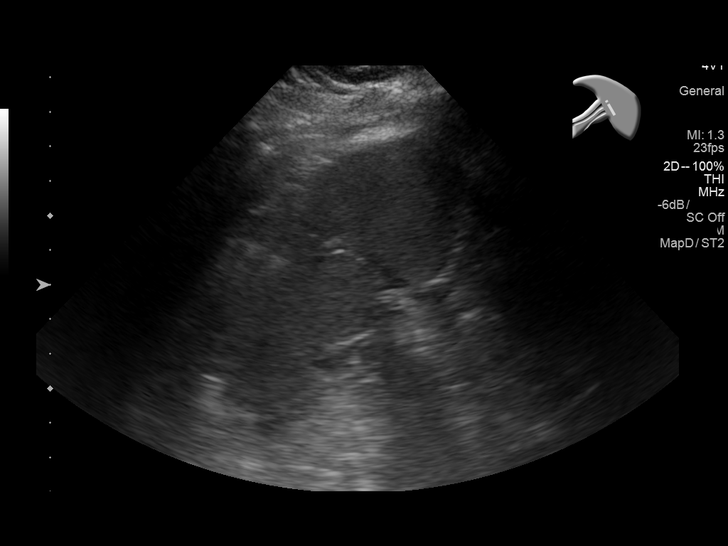
[im 59/88]
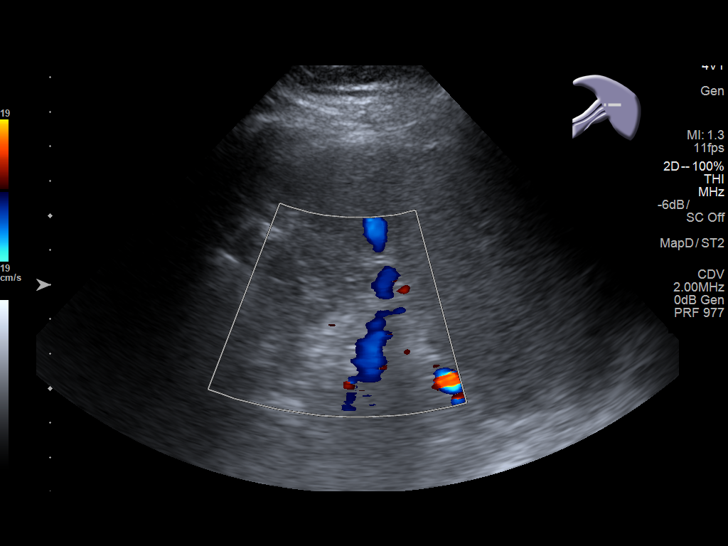
[im 66/88]
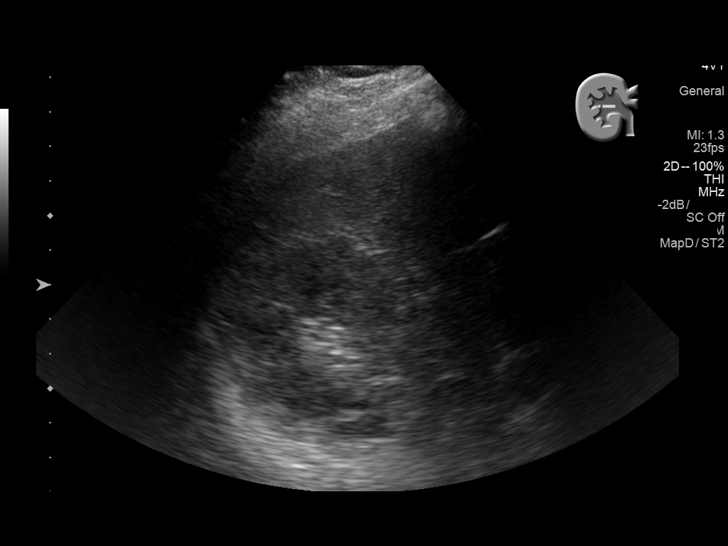
[im 73/88]
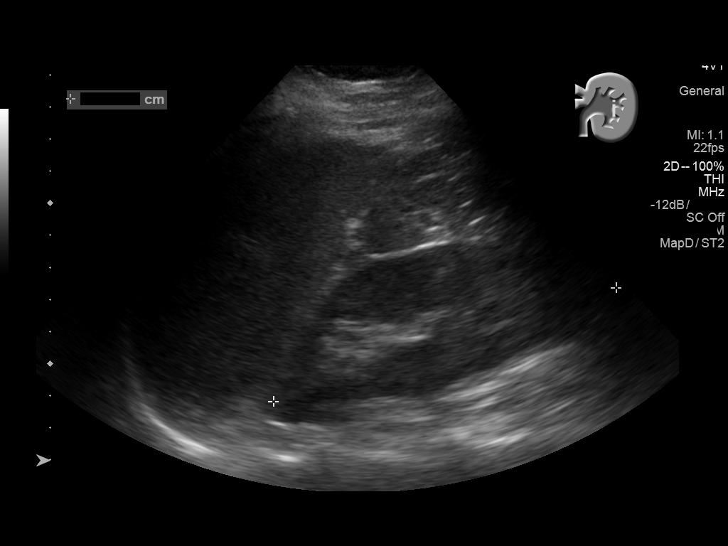
[im 80/88]
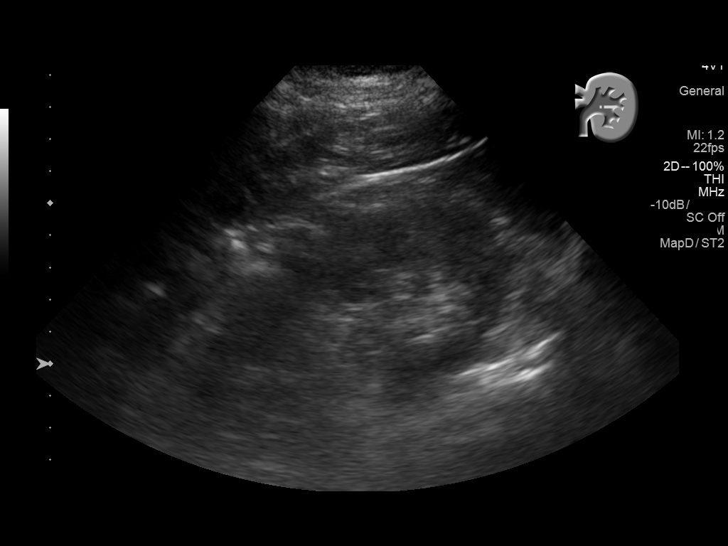
[im 88/88]
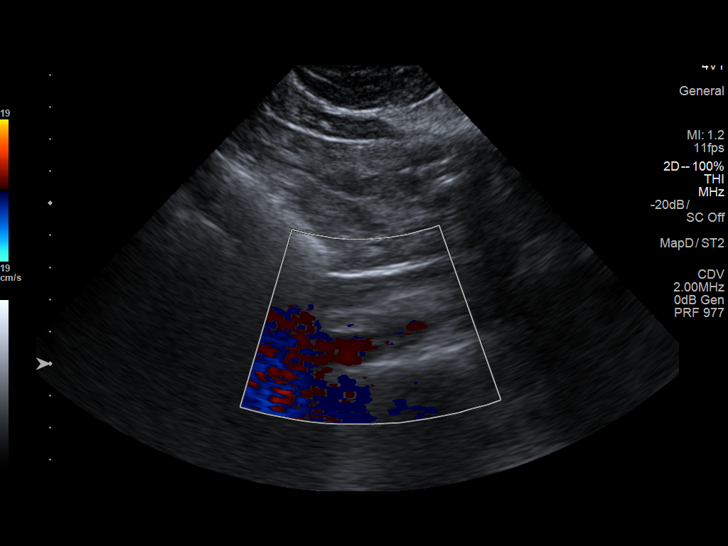

[14 of 25 positions shown; findings below may reference images not displayed]

FINDINGS: Gallbladder: No gallstones or wall thickening visualized. No
sonographic Murphy sign noted by sonographer.

Common bile duct: Diameter: 4 mm

Liver: Lobulated but overall simple 18 mm cyst in the central liver.
No evidence of solid mass. Within normal limits in parenchymal
echogenicity.

IVC: No abnormality visualized.

Pancreas: Essentially obscured.

Spleen: Size and appearance within normal limits.

Right Kidney: Length: 11 cm. Echogenicity within normal limits. No
mass or hydronephrosis visualized.

Left Kidney: Length: 11 cm. Echogenicity within normal limits. No
mass or hydronephrosis visualized.

Abdominal aorta: No aneurysm visualized.
IMPRESSION: 1. No explanation for abdominal pain.
2. Obscured pancreas.

## 2017-11-02 ENCOUNTER — Other Ambulatory Visit: Payer: Self-pay

## 2017-11-02 ENCOUNTER — Emergency Department (HOSPITAL_COMMUNITY)
Admission: EM | Admit: 2017-11-02 | Discharge: 2017-11-02 | Disposition: A | Discharge status: Home / Self Care | Payer: Self-pay | Attending: Emergency Medicine | Admitting: Emergency Medicine

## 2017-11-02 DIAGNOSIS — R338 Other retention of urine: Secondary | ICD-10-CM | POA: Insufficient documentation

## 2017-11-02 DIAGNOSIS — Z8042 Family history of malignant neoplasm of prostate: Secondary | ICD-10-CM | POA: Insufficient documentation

## 2017-11-02 DIAGNOSIS — N400 Enlarged prostate without lower urinary tract symptoms: Secondary | ICD-10-CM | POA: Insufficient documentation

## 2017-11-02 DIAGNOSIS — R102 Pelvic and perineal pain: Secondary | ICD-10-CM | POA: Insufficient documentation

## 2017-11-02 LAB — URINALYSIS, ROUTINE W REFLEX MICROSCOPIC
BILIRUBIN URINE: NEGATIVE
Glucose, UA: NEGATIVE mg/dL
Hgb urine dipstick: NEGATIVE
KETONES UR: NEGATIVE mg/dL
Leukocytes, UA: NEGATIVE
NITRITE: NEGATIVE
PROTEIN: NEGATIVE mg/dL
SPECIFIC GRAVITY, URINE: 1.015 (ref 1.005–1.030)
pH: 7 (ref 5.0–8.0)

## 2017-11-02 MED ORDER — TAMSULOSIN HCL 0.4 MG PO CAPS: 0.4000 mg | ORAL_CAPSULE | Freq: Every day | ORAL | 0 refills | Status: DC

## 2017-11-02 MED ORDER — TAMSULOSIN HCL 0.4 MG PO CAPS
0.4000 mg | ORAL_CAPSULE | Freq: Once | ORAL | Status: AC
  Administered 2017-11-02: 0.4 mg via ORAL
  Filled 2017-11-02: qty 1

## 2017-11-02 NOTE — ED Triage Notes (Signed)
Pt to Ed by POV with c/o of sporadic urination and urinary retention since 0100 this morning that kept him from sleeping. Pt states urine stream keeps going and stopping without fully releasing the urine in his bladder. Pt denies any pain in his genitals but does complain of left pelvic pain when  attempting to urinate. Pt denies andy burning or discharge. Pt states pain is 4/10. Pt states he has his prostate checked during a rectal exam a year ago and had no issues. Pt is A&O x 4.

## 2017-11-02 NOTE — ED Provider Notes (Signed)
Emergency Department Provider Note   I have reviewed the triage vital signs and the nursing notes.   HISTORY  Chief Complaint Urinary Retention and Pelvic Pain   HPI Glenn Kelly is a 47 y.o. male without significant past medical history the presents to the emergency department today secondary to intermittent episodes of urinary retention.  Patient states he will have decreased urine output and had progressively worsening suprapubic abdominal pain but then will "spread out a bunch of urine" and feel better for a little bit.  Prior to my evaluation patient had a normal urination and is asymptomatic at this time.  Does not have any pain with urination, fever, nausea, vomiting, pain with defecation, malodorous urine or discolored urine. No other associated or modifying symptoms.    No past medical history on file.  There are no active problems to display for this patient.   Past Surgical History:  Procedure Laterality Date  . congenital heart     . EYE SURGERY      Current Outpatient Rx  . Order #: [id number] Class: Normal    Allergies Patient has no known allergies.  No family history on file.  Social History Social History   Tobacco Use  . Smoking status: Never Smoker  . Smokeless tobacco: Never Used  Substance Use Topics  . Alcohol use: No  . Drug use: No    Review of Systems  All other systems negative except as documented in the HPI. All pertinent positives and negatives as reviewed in the HPI. ____________________________________________   PHYSICAL EXAM:  VITAL SIGNS: ED Triage Vitals  Enc Vitals Group     BP 11/02/17 1804 127/79     Pulse Rate 11/02/17 1804 88     Resp 11/02/17 1804 16     Temp 11/02/17 1804 98.5 F (36.9 C)     Temp Source 11/02/17 1804 Oral     SpO2 11/02/17 1804 98 %     Weight 11/02/17 1806 175 lb (79.4 kg)     Height 11/02/17 1806 5\' 6"  (1.676 m)     Head Circumference --      Peak Flow --      Pain Score 11/02/17  1805 4     Pain Loc --      Pain Edu? --      Excl. in GC? --     Constitutional: Alert and oriented. Well appearing and in no acute distress. Eyes: Conjunctivae are normal. PERRL. EOMI. Head: Atraumatic. Nose: No congestion/rhinnorhea. Mouth/Throat: Mucous membranes are moist.  Oropharynx non-erythematous. Neck: No stridor.  No meningeal signs.   Cardiovascular: Normal rate, regular rhythm. Good peripheral circulation. Grossly normal heart sounds.   Respiratory: Normal respiratory effort.  No retractions. Lungs CTAB. Gastrointestinal: Soft and nontender. No distention.  Musculoskeletal: No lower extremity tenderness nor edema. No gross deformities of extremities. Neurologic:  Normal speech and language. No gross focal neurologic deficits are appreciated.  Skin:  Skin is warm, dry and intact. No rash noted. Rectal: chaperoned by PA student, Masha. Prostate firm and slightly enlarged but not boggy or irregular in contour.  ____________________________________________   LABS (all labs ordered are listed, but only abnormal results are displayed)  Labs Reviewed  URINALYSIS, ROUTINE W REFLEX MICROSCOPIC   ____________________________________________   INITIAL IMPRESSION / ASSESSMENT AND PLAN / ED COURSE  Suspect BPH however patient does have a family history of a brother that was recently diagnosed with prostate cancer so we will follow-up with urology to get further  evaluation.  Will start on Flomax in the meantime.  No indication for Foley as he is urinating normally at this point.  Pertinent labs & imaging results that were available during my care of the patient were reviewed by me and considered in my medical decision making (see chart for details).  ____________________________________________  FINAL CLINICAL IMPRESSION(S) / ED DIAGNOSES  Final diagnoses:  Acute urinary retention  Enlarged prostate     MEDICATIONS GIVEN DURING THIS VISIT:  Medications  tamsulosin  (FLOMAX) capsule 0.4 mg (0.4 mg Oral Given 11/02/17 1927)     NEW OUTPATIENT MEDICATIONS STARTED DURING THIS VISIT:  Discharge Medication List as of 11/02/2017  7:45 PM    START taking these medications   Details  tamsulosin (FLOMAX) 0.4 MG CAPS capsule Take 1 capsule (0.4 mg total) by mouth daily., Starting Sun 11/02/2017, Normal        Note:  This note was prepared with assistance of Dragon voice recognition software. Occasional wrong-word or sound-a-like substitutions may have occurred due to the inherent limitations of voice recognition software.   Marily MemosMesner, Sosie Gato, MD 11/02/17 [phone]

## 2017-11-02 NOTE — Discharge Instructions (Signed)
Your prostate is enlarged on exam and hard consistent with likely benign prostatic hypertrophy however with your family history of prostate cancer you need to see a urologist to get this evaluated further and possibly to check blood levels to ensure not cancer.  If you have return of urinary retention with severe pain please return to the emergency department immediately.

## 2018-07-03 ENCOUNTER — Other Ambulatory Visit: Payer: Self-pay

## 2018-07-03 ENCOUNTER — Encounter (HOSPITAL_COMMUNITY): Payer: Self-pay

## 2018-07-03 ENCOUNTER — Emergency Department (HOSPITAL_COMMUNITY)
Admission: EM | Admit: 2018-07-03 | Discharge: 2018-07-03 | Disposition: A | Discharge status: Home / Self Care | Payer: Self-pay | Attending: Emergency Medicine | Admitting: Emergency Medicine

## 2018-07-03 DIAGNOSIS — Z79899 Other long term (current) drug therapy: Secondary | ICD-10-CM | POA: Insufficient documentation

## 2018-07-03 DIAGNOSIS — L237 Allergic contact dermatitis due to plants, except food: Secondary | ICD-10-CM | POA: Insufficient documentation

## 2018-07-03 MED ORDER — DIPHENHYDRAMINE HCL 25 MG PO CAPS
50.0000 mg | ORAL_CAPSULE | Freq: Once | ORAL | Status: AC
Start: 2018-07-03 — End: 2018-07-03
  Administered 2018-07-03: 50 mg via ORAL
  Filled 2018-07-03: qty 2

## 2018-07-03 MED ORDER — PREDNISONE 10 MG PO TABS: ORAL_TABLET | ORAL | 0 refills | Status: DC

## 2018-07-03 MED ORDER — TRIAMCINOLONE ACETONIDE 0.1 % EX CREA: 1.0000 "application " | TOPICAL_CREAM | Freq: Two times a day (BID) | CUTANEOUS | 0 refills | Status: DC

## 2018-07-03 NOTE — ED Provider Notes (Signed)
[facility]-EMERGENCY DEPT Provider Note   CSN: [id number] Arrival date & time: 07/03/18  1621    History   Chief Complaint Chief Complaint  Patient presents with  . Rash    HPI Glenn Kelly is a 48 y.o. male.     The history is provided by the patient. No language interpreter was used.  Rash  Associated symptoms: no fever      48 year old male presenting for evaluation of rash.  Patient report 3 days ago he noticed itchy blistering rash which appears on his left forearm, bilateral anterior legs, and mid abdomen.  Symptom is moderate in severity and not improved despite using over-the-counter cream.  He did recall working out in the yard several days prior to that.  He denies any associated fever, headache, sore throat, chest pain, trouble breathing, abdominal cramping.  He denies any other environmental changes.  No past medical history on file.  There are no active problems to display for this patient.   Past Surgical History:  Procedure Laterality Date  . congenital heart     . EYE SURGERY          Home Medications    Prior to Admission medications   Medication Sig Start Date End Date Taking? Authorizing Provider  tamsulosin (FLOMAX) 0.4 MG CAPS capsule Take 1 capsule (0.4 mg total) by mouth daily. 11/02/17   Mesner, Barbara Cower, MD    Family History No family history on file.  Social History Social History   Tobacco Use  . Smoking status: Never Smoker  . Smokeless tobacco: Never Used  Substance Use Topics  . Alcohol use: No  . Drug use: No     Allergies   Patient has no known allergies.   Review of Systems Review of Systems  Constitutional: Negative for fever.  Skin: Positive for rash.     Physical Exam Updated Vital Signs BP (!) 123/94 (BP Location: Left Arm)   Pulse 99   Temp 97.9 F (36.6 C) (Oral)   Resp 16   Ht 5\' 6"  (1.676 m)   Wt 77.1 kg   SpO2 98%   BMI 27.44 kg/m   Physical Exam Vitals signs and  nursing note reviewed.  Constitutional:      General: He is not in acute distress.    Appearance: He is well-developed.  HENT:     Head: Atraumatic.  Eyes:     Conjunctiva/sclera: Conjunctivae normal.  Neck:     Musculoskeletal: Neck supple.  Skin:    Findings: Rash (Left forearm: Scattered vesicular lesions noted to the dorsum of forearm.  Linear blisters noted to anterior tib-fib bilaterally) present.  Neurological:     Mental Status: He is alert.      ED Treatments / Results  Labs (all labs ordered are listed, but only abnormal results are displayed) Labs Reviewed - No data to display  EKG None  Radiology No results found.  Procedures Procedures (including critical care time)  Medications Ordered in ED Medications - No data to display   Initial Impression / Assessment and Plan / ED Course  I have reviewed the triage vital signs and the nursing notes.  Pertinent labs & imaging results that were available during my care of the patient were reviewed by me and considered in my medical decision making (see chart for details).        BP (!) 123/94 (BP Location: Left Arm)   Pulse 99   Temp 97.9 F (36.6 C) (  Oral)   Resp 16   Ht 5\' 6"  (1.676 m)   Wt 77.1 kg   SpO2 98%   BMI 27.44 kg/m    Final Clinical Impressions(s) / ED Diagnoses   Final diagnoses:  Poison ivy dermatitis    ED Discharge Orders         Ordered    predniSONE (DELTASONE) 10 MG tablet     07/03/18 1659    triamcinolone cream (KENALOG) 0.1 %  2 times daily     07/03/18 1659         Presentation consistent with contact dermatitis as patient was exposed to suspected poison ivy/oak/sumac. No evidence of systemic symptoms of anaphylaxis, infections including cellulitis, or bullous disorders. Patient prescribed prednisone taper and steroid cream.  Advised to monitor exposures and record symptoms.  Supportive therapies discussed.   Follow up with primary physician in 2-5 days if symptoms  continue. Return to ED if high fever, spread of rash, pain, or other concerns.  Impression: Contact Dermatitis Rash   Plan:    * Discharge from ED   * Prescribed prednisone PO taper at 40mg  qd x3d, 30mg  qd x3d, 20mg  qd x3d, and 10mg  qd x3d.   * Prescribed triamcinolone 0.1% topically bid x <2wk; directed Pt to apply a thin layer to affected sites, avoiding contact w/ face and genitals.   * Advised Pt to take an OTC antihistamine as directed prn.   * Advised Pt on supportive measures, including taking cool showers, using Ivy Block or Aveeno or Calamine lotion, trimming nails and limiting excoriation, identifying and avoiding allergen source, washing suspected clothes and materials that may be allergen carriers in hot water and detergent, bathe pets, and cover any oozing blisters as they may form.   * Instructed Pt to f/up w/ PCP or ETC should Sx worsen or not improve. Pt verbally expressed understanding and all questions were addressed to Pt's satisfaction.    Fayrene Helperran, Sehar Sedano, PA-C 07/03/18 1701    Benjiman CorePickering, Nathan, MD 07/03/18 [phone]

## 2018-07-03 NOTE — Discharge Instructions (Addendum)
Take steroid and use steroid cream as prescribed.  Taking cool showers, using Ivy Block or Aveeno or Calamine lotion, trimming nails and limiting excoriation, identifying and avoiding allergen source, washing suspected clothes and materials that may be allergen carriers in hot water and detergent, bathe pets, and cover any oozing blisters as they may form.

## 2018-07-03 NOTE — ED Triage Notes (Signed)
Patient c/o rash, itaching and blisters on left forearm, and bilateral lower extremities x 4 days. Patient states that he was doing yard work approx 1 week ago.

## 2019-10-09 ENCOUNTER — Emergency Department (HOSPITAL_COMMUNITY)
Admission: EM | Admit: 2019-10-09 | Discharge: 2019-10-09 | Disposition: A | Discharge status: Home / Self Care | Payer: Self-pay | Attending: Emergency Medicine | Admitting: Emergency Medicine

## 2019-10-09 ENCOUNTER — Other Ambulatory Visit: Payer: Self-pay

## 2019-10-09 ENCOUNTER — Encounter (HOSPITAL_COMMUNITY): Payer: Self-pay

## 2019-10-09 DIAGNOSIS — H6123 Impacted cerumen, bilateral: Secondary | ICD-10-CM | POA: Insufficient documentation

## 2019-10-09 DIAGNOSIS — Z79899 Other long term (current) drug therapy: Secondary | ICD-10-CM | POA: Insufficient documentation

## 2019-10-09 NOTE — ED Notes (Signed)
I irrigated bilat. Ears. I obtained a large amount of wax from his left ear and a moderate amount of wax from his right ear. Dr. Denton Lank checked him after this procedure and ok'd his d/c.

## 2019-10-09 NOTE — ED Provider Notes (Signed)
[facility]-EMERGENCY DEPT Provider Note   CSN: [id number] Arrival date & time: 10/09/19  1455     History Chief Complaint  Patient presents with  . Ear Problem    Glenn Kelly is a 49 y.o. male.  Patient c/o left > right ear feels clogged up for the past couple days. Symptoms gradual onset, moderate, persistent. Remote hx cerumen impaction/irrigation. Denies any trauma or injury to ear. No drainage or bleeding. No tinnitus or hearing loss. No headache. No sore throat or uri symptoms. No fevers.   The history is provided by the patient.       History reviewed. No pertinent past medical history.  There are no problems to display for this patient.   Past Surgical History:  Procedure Laterality Date  . congenital heart     . EYE SURGERY         No family history on file.  Social History   Tobacco Use  . Smoking status: Never Smoker  . Smokeless tobacco: Never Used  Vaping Use  . Vaping Use: Never used  Substance Use Topics  . Alcohol use: No  . Drug use: No    Home Medications Prior to Admission medications   Medication Sig Start Date End Date Taking? Authorizing Provider  predniSONE (DELTASONE) 10 MG tablet 40mg  qd x3d, 30mg  qd x3d, 20mg  qd x3d, and 10mg  qd x3d. 07/03/18   , PA-C  tamsulosin (FLOMAX) 0.4 MG CAPS capsule Take 1 capsule (0.4 mg total) by mouth daily. 11/02/17   Mesner, , MD  triamcinolone cream (KENALOG) 0.1 % Apply 1 application topically 2 (two) times daily. 07/03/18   Fayrene Helper, PA-C    Allergies    Patient has no known allergies.  Review of Systems   Review of Systems  Constitutional: Negative for fever.  HENT: Negative for sore throat.   Respiratory: Negative for cough.   Neurological: Negative for dizziness.    Physical Exam Updated Vital Signs BP 111/74 (BP Location: Left Arm)   Pulse 99   Temp 98.3 F (36.8 C) (Oral)   Resp 18   Ht 1.676 m (5\' 6" )   Wt 71.4 kg   SpO2 96%   BMI  25.41 kg/m   Physical Exam Vitals and nursing note reviewed.  Constitutional:      Appearance: Normal appearance. He is well-developed.  HENT:     Head: Atraumatic.     Right Ear: There is impacted cerumen.     Left Ear: There is impacted cerumen.     Nose: Nose normal.     Mouth/Throat:     Mouth: Mucous membranes are moist.  Eyes:     General: No scleral icterus.    Conjunctiva/sclera: Conjunctivae normal.  Neck:     Trachea: No tracheal deviation.  Cardiovascular:     Rate and Rhythm: Normal rate.     Pulses: Normal pulses.  Pulmonary:     Effort: Pulmonary effort is normal. No accessory muscle usage or respiratory distress.  Genitourinary:    Comments: No cva tenderness. Musculoskeletal:        General: No swelling.     Cervical back: Normal range of motion and neck supple. No rigidity.  Skin:    General: Skin is warm and dry.     Findings: No rash.  Neurological:     Mental Status: He is alert.     Comments: Alert, speech clear. Steady gait.  Psychiatric:  Mood and Affect: Mood normal.     ED Results / Procedures / Treatments   Labs (all labs ordered are listed, but only abnormal results are displayed) Labs Reviewed - No data to display  EKG None  Radiology No results found.  Procedures Procedures (including critical care time)  Medications Ordered in ED Medications - No data to display  ED Course  I have reviewed the triage vital signs and the nursing notes.  Pertinent labs & imaging results that were available during my care of the patient were reviewed by me and considered in my medical decision making (see chart for details).    MDM Rules/Calculators/A&P                          Exam c/w bilateral cerumen impaction.  EAC irrigation by nursing staff.  Reviewed nursing notes and prior charts for additional history.   Patient appears stable for d/c.     Final Clinical Impression(s) / ED Diagnoses Final diagnoses:  None    Rx  / DC Orders ED Discharge Orders    None       Cathren Laine, MD 10/09/19 [phone]

## 2019-10-09 NOTE — ED Triage Notes (Signed)
He states his left ear feels "clogged up" x 2 days. He has had to have ear irrigation(s) in the past. He denies fever, nor any other sign of illness and is in no distress.

## 2019-10-09 NOTE — Discharge Instructions (Addendum)
It was our pleasure to provide your ER care today - we hope that you feel better.  Avoid use q-tips in ears, as that often just packs the ear wax further into the ear canal.   Follow up with primary care doctor.   Return to ER if worse, new symptoms, severe ear pain, hearing loss, bleeding, or other concern.

## 2020-12-19 ENCOUNTER — Emergency Department (HOSPITAL_COMMUNITY)
Admission: EM | Admit: 2020-12-19 | Discharge: 2020-12-19 | Disposition: A | Discharge status: Home / Self Care | Payer: Self-pay | Attending: Emergency Medicine | Admitting: Emergency Medicine

## 2020-12-19 ENCOUNTER — Encounter (HOSPITAL_COMMUNITY): Payer: Self-pay

## 2020-12-19 ENCOUNTER — Other Ambulatory Visit: Payer: Self-pay

## 2020-12-19 DIAGNOSIS — Z5321 Procedure and treatment not carried out due to patient leaving prior to being seen by health care provider: Secondary | ICD-10-CM | POA: Insufficient documentation

## 2020-12-19 DIAGNOSIS — R350 Frequency of micturition: Secondary | ICD-10-CM | POA: Insufficient documentation

## 2020-12-19 LAB — URINALYSIS, ROUTINE W REFLEX MICROSCOPIC
Bilirubin Urine: NEGATIVE
Glucose, UA: NEGATIVE mg/dL
Hgb urine dipstick: NEGATIVE
Ketones, ur: NEGATIVE mg/dL
Leukocytes,Ua: NEGATIVE
Nitrite: NEGATIVE
Protein, ur: NEGATIVE mg/dL
Specific Gravity, Urine: >1.03 — ABNORMAL HIGH (ref 1.005–1.030)
pH: 6 (ref 5.0–8.0)

## 2020-12-19 LAB — CBG MONITORING, ED: Glucose-Capillary: 86 mg/dL (ref 70–99)

## 2020-12-19 NOTE — ED Triage Notes (Signed)
Pt complains of urinary frequency since last night. Pt states that he could not go to sleep because he had to keep getting up to urinate.

## 2020-12-21 ENCOUNTER — Encounter (HOSPITAL_COMMUNITY): Payer: Self-pay | Admitting: Emergency Medicine

## 2020-12-21 ENCOUNTER — Emergency Department (HOSPITAL_COMMUNITY)
Admission: EM | Admit: 2020-12-21 | Discharge: 2020-12-21 | Disposition: A | Discharge status: Home / Self Care | Payer: Self-pay | Attending: Emergency Medicine | Admitting: Emergency Medicine

## 2020-12-21 DIAGNOSIS — R350 Frequency of micturition: Secondary | ICD-10-CM | POA: Insufficient documentation

## 2020-12-21 LAB — CBG MONITORING, ED: Glucose-Capillary: 79 mg/dL (ref 70–99)

## 2020-12-21 MED ORDER — TAMSULOSIN HCL 0.4 MG PO CAPS: 0.4000 mg | ORAL_CAPSULE | Freq: Every day | ORAL | 0 refills | Status: DC

## 2020-12-21 MED ORDER — TAMSULOSIN HCL 0.4 MG PO CAPS
0.4000 mg | ORAL_CAPSULE | Freq: Once | ORAL | Status: AC
  Administered 2020-12-21: 0.4 mg via ORAL
  Filled 2020-12-21: qty 1

## 2020-12-21 NOTE — ED Triage Notes (Addendum)
Pt c/o urinary frequency and intermittent right sided groin pain x 1 week. Denies fever, n/v.

## 2020-12-21 NOTE — ED Provider Notes (Signed)
[facility]-EMERGENCY DEPT Provider Note   CSN: [id number] Arrival date & time: 12/21/20  0234     History Chief Complaint  Patient presents with   Urinary Frequency    Glenn Kelly is a 50 y.o. male.  50 year old male who presents the emergency department today secondary to increased frequency of urination.  Patient states he has had multiple episodes of urination throughout the night ready does not usually.  He states that with these episodes he feels like the urine is under pressure when it comes out and is difficult to push it out.  Patient states that he has had this before but not recently.  Was seen here couple days ago at which time urinalysis was negative and CBG within normal limits.  Patient states this does not seem symptoms.  They have been going on for about a week now.   Urinary Frequency      History reviewed. No pertinent past medical history.  There are no problems to display for this patient.   Past Surgical History:  Procedure Laterality Date   congenital heart      EYE SURGERY         No family history on file.  Social History   Tobacco Use   Smoking status: Never   Smokeless tobacco: Never  Vaping Use   Vaping Use: Never used  Substance Use Topics   Alcohol use: No   Drug use: No    Home Medications Prior to Admission medications   Medication Sig Start Date End Date Taking? Authorizing Provider  tamsulosin (FLOMAX) 0.4 MG CAPS capsule Take 1 capsule (0.4 mg total) by mouth daily. 12/21/20 01/20/21  Kaileen Bronkema, Barbara Cower, MD    Allergies    Patient has no known allergies.  Review of Systems   Review of Systems  Genitourinary:  Positive for frequency.  All other systems reviewed and are negative.  Physical Exam Updated Vital Signs BP 117/73 (BP Location: Right Arm)   Pulse 63   Temp 97.7 F (36.5 C) (Oral)   Resp 16   Ht 5\' 6"  (1.676 m)   Wt 75 kg   SpO2 99%   BMI 26.69 kg/m   Physical Exam Vitals and  nursing note reviewed.  Constitutional:      Appearance: He is well-developed.  HENT:     Head: Normocephalic and atraumatic.     Nose: No congestion or rhinorrhea.     Mouth/Throat:     Mouth: Mucous membranes are moist.     Pharynx: Oropharynx is clear.  Eyes:     Pupils: Pupils are equal, round, and reactive to light.  Cardiovascular:     Rate and Rhythm: Normal rate.  Pulmonary:     Effort: Pulmonary effort is normal. No respiratory distress.  Abdominal:     General: Abdomen is flat. There is no distension.  Musculoskeletal:        General: No swelling or tenderness. Normal range of motion.     Cervical back: Normal range of motion.  Skin:    General: Skin is warm and dry.     Coloration: Skin is not jaundiced or pale.  Neurological:     General: No focal deficit present.     Mental Status: He is alert.    ED Results / Procedures / Treatments   Labs (all labs ordered are listed, but only abnormal results are displayed) Labs Reviewed  CBG MONITORING, ED    EKG None  Radiology No results  found.  Procedures Procedures   Medications Ordered in ED Medications  tamsulosin (FLOMAX) capsule 0.4 mg (0.4 mg Oral Given 12/21/20 7371)    ED Course  I have reviewed the triage vital signs and the nursing notes.  Pertinent labs & imaging results that were available during my care of the patient were reviewed by me and considered in my medical decision making (see chart for details).    MDM Rules/Calculators/A&P                           Patient with likely BPH.  Discussed that strong need for him to get a primary care provider as apparently this had happened a couple years ago as well.  He needs a PSA checked and recommendations/referrals as necessary.  No indication for further work-up in the emergency room or hospitalization at this time.  Started on Flomax.  Final Clinical Impression(s) / ED Diagnoses Final diagnoses:  Urinary frequency    Rx / DC Orders ED  Discharge Orders          Ordered    tamsulosin (FLOMAX) 0.4 MG CAPS capsule  Daily        12/21/20 0325             Melchizedek Espinola, Barbara Cower, MD 12/21/20 [phone]

## 2021-01-10 ENCOUNTER — Inpatient Hospital Stay: Payer: Self-pay | Admitting: Physician Assistant

## 2021-01-10 NOTE — Progress Notes (Signed)
Patient ID: Glenn Kelly, male   DOB: 11-14-1970, 50 y.o.   MRN: [id number]    Glenn Kelly, is a 50 y.o. male  JSE:[id number]  WVP:[id number]  DOB - May 25, 1970  Chief Complaint  Patient presents with   Hospitalization Follow-up       Subjective:   Glenn Kelly is a 50 y.o. male here today for a follow up visit and to establish care after being Seen at ED 12/21/2020 for BPH/urinary hesitancy and needs a PCP.  He was prescribed flomax which he thinks helps some.  No glucosuria.  Blood sugar was normal that day but I do see some prior blood sugars above 100.  He says this has been going on a while.  Nocturia.  Works an unusual schedule but he notice it most when he tries to sleep.  Also c/o "bulging" in his R inguinal region.  He only noticed this recently.  No prostate CA in his family.  No other CA that he is aware of.  No fever or night sweats.  No pain with urination.    Patient has No headache, No chest pain, No abdominal pain - No Nausea, No new weakness tingling or numbness, No Cough - SOB.  No problems updated.  ALLERGIES: No Known Allergies  PAST MEDICAL HISTORY: History reviewed. No pertinent past medical history.  MEDICATIONS AT HOME: Prior to Admission medications   Medication Sig Start Date End Date Taking? Authorizing Provider  tamsulosin (FLOMAX) 0.4 MG CAPS capsule Take 1 capsule (0.4 mg total) by mouth daily. 01/11/21 02/10/21  Anders Simmonds, PA-C    ROS: Neg HEENT Neg resp Neg cardiac Neg GI Neg MS Neg psych Neg neuro  Objective:   Vitals:   01/11/21 0847  BP: 114/73  Pulse: 72  SpO2: 98%  Weight: 168 lb 6 oz (76.4 kg)  Height: 5\' 6"  (1.676 m)   Exam General appearance : Awake, alert, not in any distress. Speech Clear. Not toxic looking HEENT: Atraumatic and Normocephalic Neck: Supple, no JVD. No cervical lymphadenopathy.  Chest: Good air entry bilaterally, CTAB.  No rales/rhonchi/wheezing CVS: S1 S2 regular, no murmurs.  There is a firm  fullness in his R pelvis just below the panna.  No hernia appreciated Extremities: B/L Lower Ext shows no edema, both legs are warm to touch Neurology: Awake alert, and oriented X 3, CN II-XII intact, Non focal Skin: No Rash  Data Review No results found for: HGBA1C  Assessment & Plan   1. Benign prostatic hyperplasia with lower urinary tract symptoms, symptom details unspecified Continue flomax - PSA - CBC with Differential/Platelet - Ambulatory referral to Urology  2. Hyperglycemia I have had a lengthy discussion and provided education about insulin resistance and the intake of too much sugar/refined carbohydrates.  I have advised the patient to work at a goal of eliminating sugary drinks, candy, desserts, sweets, refined sugars, processed foods, and white carbohydrates.  The patient expresses understanding.   - Hemoglobin A1c - Comprehensive metabolic panel  3. Screening cholesterol level - Lipid panel  4. Lump in the groin - CBC with Differential/Platelet - Ambulatory referral to Urology    Patient have been counseled extensively about nutrition and exercise. Other issues discussed during this visit include: low cholesterol diet, weight control and daily exercise, foot care, annual eye examinations at Ophthalmology, importance of adherence with medications and regular follow-up. We also discussed long term complications of uncontrolled diabetes and hypertension.   Return in about 3 months (around 04/13/2021) for  assign PCP.  The patient was given clear instructions to go to ER or return to medical center if symptoms don't improve, worsen or new problems develop. The patient verbalized understanding. The patient was told to call to get lab results if they haven't heard anything in the next week.      Georgian Co, PA-C [facility] [place], [place] [phone]   01/11/2021, 9:04 AM

## 2021-01-11 ENCOUNTER — Encounter: Payer: Self-pay | Admitting: Physician Assistant

## 2021-01-11 ENCOUNTER — Ambulatory Visit: Payer: No Typology Code available for payment source | Attending: Physician Assistant | Admitting: Physician Assistant

## 2021-01-11 ENCOUNTER — Other Ambulatory Visit: Payer: Self-pay

## 2021-01-11 VITALS — BP 114/73 | HR 72 | Ht 66.0 in | Wt 168.4 lb

## 2021-01-11 DIAGNOSIS — Z13220 Encounter for screening for lipoid disorders: Secondary | ICD-10-CM

## 2021-01-11 DIAGNOSIS — R1909 Other intra-abdominal and pelvic swelling, mass and lump: Secondary | ICD-10-CM

## 2021-01-11 DIAGNOSIS — R739 Hyperglycemia, unspecified: Secondary | ICD-10-CM

## 2021-01-11 DIAGNOSIS — N401 Enlarged prostate with lower urinary tract symptoms: Secondary | ICD-10-CM | POA: Diagnosis not present

## 2021-01-11 MED ORDER — TAMSULOSIN HCL 0.4 MG PO CAPS: 0.4000 mg | ORAL_CAPSULE | Freq: Every day | ORAL | 3 refills | Status: AC

## 2021-01-12 LAB — CBC WITH DIFFERENTIAL/PLATELET
Basophils Absolute: 0 10*3/uL (ref 0.0–0.2)
Basos: 1 %
EOS (ABSOLUTE): 0.1 10*3/uL (ref 0.0–0.4)
Eos: 2 %
Hematocrit: 43.9 % (ref 37.5–51.0)
Hemoglobin: 15.4 g/dL (ref 13.0–17.7)
Immature Grans (Abs): 0 10*3/uL (ref 0.0–0.1)
Immature Granulocytes: 0 %
Lymphocytes Absolute: 1.3 10*3/uL (ref 0.7–3.1)
Lymphs: 22 %
MCH: 31.6 pg (ref 26.6–33.0)
MCHC: 35.1 g/dL (ref 31.5–35.7)
MCV: 90 fL (ref 79–97)
Monocytes Absolute: 0.6 10*3/uL (ref 0.1–0.9)
Monocytes: 9 %
Neutrophils Absolute: 4.1 10*3/uL (ref 1.4–7.0)
Neutrophils: 66 %
Platelets: 265 10*3/uL (ref 150–450)
RBC: 4.88 x10E6/uL (ref 4.14–5.80)
RDW: 11.5 % — ABNORMAL LOW (ref 11.6–15.4)
WBC: 6.2 10*3/uL (ref 3.4–10.8)

## 2021-01-12 LAB — COMPREHENSIVE METABOLIC PANEL
ALT: 10 IU/L (ref 0–44)
AST: 21 IU/L (ref 0–40)
Albumin/Globulin Ratio: 2.5 — ABNORMAL HIGH (ref 1.2–2.2)
Albumin: 4.9 g/dL (ref 4.0–5.0)
Alkaline Phosphatase: 38 IU/L — ABNORMAL LOW (ref 44–121)
BUN/Creatinine Ratio: 17 (ref 9–20)
BUN: 18 mg/dL (ref 6–24)
Bilirubin Total: 1.4 mg/dL — ABNORMAL HIGH (ref 0.0–1.2)
CO2: 21 mmol/L (ref 20–29)
Calcium: 9.1 mg/dL (ref 8.7–10.2)
Chloride: 102 mmol/L (ref 96–106)
Creatinine, Ser: 1.06 mg/dL (ref 0.76–1.27)
Globulin, Total: 2 g/dL (ref 1.5–4.5)
Glucose: 91 mg/dL (ref 70–99)
Potassium: 4.2 mmol/L (ref 3.5–5.2)
Sodium: 139 mmol/L (ref 134–144)
Total Protein: 6.9 g/dL (ref 6.0–8.5)
eGFR: 85 mL/min/{1.73_m2} (ref >59)

## 2021-01-12 LAB — LIPID PANEL
Chol/HDL Ratio: 2.9 ratio (ref 0.0–5.0)
Cholesterol, Total: 178 mg/dL (ref 100–199)
HDL: 61 mg/dL (ref >39)
LDL Chol Calc (NIH): 105 mg/dL — ABNORMAL HIGH (ref 0–99)
Triglycerides: 60 mg/dL (ref 0–149)
VLDL Cholesterol Cal: 12 mg/dL (ref 5–40)

## 2021-01-12 LAB — HEMOGLOBIN A1C
Est. average glucose Bld gHb Est-mCnc: 91 mg/dL
Hgb A1c MFr Bld: 4.8 % (ref 4.8–5.6)

## 2021-01-12 LAB — PSA: Prostate Specific Ag, Serum: 0.5 ng/mL (ref 0.0–4.0)

## 2021-01-15 ENCOUNTER — Telehealth: Payer: Self-pay | Admitting: Family Medicine

## 2021-01-15 NOTE — Telephone Encounter (Signed)
Referral Request - Has patient seen PCP for this complaint? yes *If NO, is insurance requiring patient see PCP for this issue before PCP can refer them? Referral for which specialty: urologist Preferred provider/office:N/A patient states previous referral to [facility] doesn't take his insurance Reason for referral: lump in the groin

## 2021-01-16 ENCOUNTER — Telehealth: Payer: Self-pay | Admitting: *Deleted

## 2021-01-16 NOTE — Telephone Encounter (Signed)
Copied from CRM [id number]. Topic: Appointment Scheduling - Scheduling Inquiry for Clinic >> Jan 16, 2021  1:34 PM Elliot Gault wrote: Patient inquiring about lab results

## 2021-01-16 NOTE — Telephone Encounter (Signed)
Patient checking on the status of message below. Patient states urology is not in network. Patient would like a follow up call.

## 2021-01-17 NOTE — Telephone Encounter (Signed)
Pt inquiring about recent lab results

## 2021-01-19 ENCOUNTER — Telehealth (INDEPENDENT_AMBULATORY_CARE_PROVIDER_SITE_OTHER): Payer: Self-pay

## 2021-01-19 NOTE — Telephone Encounter (Signed)
Patient returned call and was informed of all results. Her verbalized understanding. Maryjean Morn, CMA

## 2021-01-19 NOTE — Telephone Encounter (Signed)
-----   Message from Anders Simmonds, [place] sent at 01/17/2021  2:25 PM EDT ----- Please call patient.  Your blood test for prostate levels was normal.  No diabetes.  Kidney, liver function, and electrolytes are normal.  Blood count is normal.  Cholesterol is overall good.  "Bad" cholesterol could be made better by healthier diet and exercise.  Follow up as planned.  Thanks, Georgian Co, PA-C

## 2021-01-25 ENCOUNTER — Ambulatory Visit (INDEPENDENT_AMBULATORY_CARE_PROVIDER_SITE_OTHER): Payer: PRIVATE HEALTH INSURANCE | Admitting: Urology

## 2021-01-25 ENCOUNTER — Other Ambulatory Visit: Payer: Self-pay

## 2021-01-25 VITALS — BP 110/71 | HR 71 | Ht 66.0 in | Wt 167.2 lb

## 2021-01-25 DIAGNOSIS — N401 Enlarged prostate with lower urinary tract symptoms: Secondary | ICD-10-CM | POA: Diagnosis not present

## 2021-01-25 DIAGNOSIS — K4090 Unilateral inguinal hernia, without obstruction or gangrene, not specified as recurrent: Secondary | ICD-10-CM | POA: Diagnosis not present

## 2021-01-25 LAB — BLADDER SCAN AMB NON-IMAGING

## 2021-01-25 NOTE — Progress Notes (Signed)
   01/25/21 8:53 AM   Irven D Blau 01-10-1971 [id number]  CC: Nocturia, urinary frequency, right scrotal swelling  HPI: 50 year old male who does not get routine medical care who was seen in the ER multiple times for urinary frequency and right scrotal swelling.  Work-up was completely benign with normal renal function, no evidence of diabetes, normal dipstick urinalysis.  No imaging has been performed.  He was started on Flomax which has improved his urinary symptoms somewhat.  He also is drinking quite a bit of soda and has cut back which has improved the urination as well.  He typically goes to bed about 2 AM since he works very late.  The symptoms have been going on at least a few months, in addition to some swelling in the right scrotum.  He denies any gross hematuria or dysuria.  Most bothersome symptom right now is nocturia 2-3 times overnight, and symptoms have improved significantly during the day.  PSA normal at 0.5.  Urinalysis today is completely benign, PVR is normal at 4 mL.    Surgical History: Past Surgical History:  Procedure Laterality Date   congenital heart      EYE SURGERY        Family History: No family history on file.  Social History:  reports that he has never smoked. He has never used smokeless tobacco. He reports that he does not drink alcohol and does not use drugs.  Physical Exam: BP 110/71 (BP Location: Left Arm, Patient Position: Sitting, Cuff Size: Normal)   Pulse 71   Ht 5\' 6"  (1.676 m)   Wt 167 lb 3.2 oz (75.8 kg)   BMI 26.99 kg/m    Constitutional:  Alert and oriented, No acute distress. Cardiovascular: No clubbing, cyanosis, or edema. Respiratory: Normal respiratory effort, no increased work of breathing. GI: Abdomen is soft, nontender, nondistended, no abdominal masses GU: Patent meatus, no lesions, significant right scrotal swelling up into the right groin concerning for right inguinal hernia, testicles 20 cc and descended bilaterally,  no masses DRE: Patient deferred  Assessment & Plan:   50 year old male with a few months of nocturia 2-3 times overnight and urinary frequency, improved with cutting back on sodas and starting Flomax.  He has significant snoring concerning for possible sleep apnea.  He also has a significant right scrotal bulge concerning for right inguinal hernia.  Urinalysis is completely benign x2, and PVR is normal at 4 mL.  We focused on behavioral strategies including avoiding bladder irritants, minimizing fluids before bed, and being evaluated for sleep apnea.  We reviewed the relationship between sleep apnea and nocturia at length.  I also recommended referral to general surgery for further evaluation of his suspected right inguinal hernia  Recommend sleep apnea evaluation Referral placed to general surgery for possible right inguinal hernia RTC with me 6 months PVR and symptom check   44, MD 01/25/2021  [facility] [address] [place], [place] [phone]

## 2021-01-25 NOTE — Patient Instructions (Addendum)
It looks like you have a inguinal hernia on the right side, and a referral has been placed to a Development worker, international aid.  I also recommend being evaluated for sleep apnea, as this may be causing your urination overnight, discuss this with your PCP to order testing.  Minimize fluid intake 3 to 4 hours before bedtime, urinate right before going to bed.  Avoid diet drinks, sodas, and caffeine in the afternoon and evening.  Sleep Apnea Sleep apnea affects breathing during sleep. It causes breathing to stop for 10 seconds or more, or to become shallow. People with sleep apnea usually snore loudly. It can also increase the risk of: Heart attack. Stroke. Being very overweight (obese). Diabetes. Heart failure. Irregular heartbeat. High blood pressure. The goal of treatment is to help you breathe normally again. What are the causes? The most common cause of this condition is a collapsed or blocked airway. There are three kinds of sleep apnea: Obstructive sleep apnea. This is caused by a blocked or collapsed airway. Central sleep apnea. This happens when the brain does not send the right signals to the muscles that control breathing. Mixed sleep apnea. This is a combination of obstructive and central sleep apnea. What increases the risk? Being overweight. Smoking. Having a small airway. Being older. Being male. Drinking alcohol. Taking medicines to calm yourself (sedatives or tranquilizers). Having family members with the condition. Having a tongue or tonsils that are larger than normal. What are the signs or symptoms? Trouble staying asleep. Loud snoring. Headaches in the morning. Waking up gasping. Dry mouth or sore throat in the morning. Being sleepy or tired during the day. If you are sleepy or tired during the day, you may also: Not be able to focus your mind (concentrate). Forget things. Get angry a lot and have mood swings. Feel sad (depressed). Have changes in your personality. Have  less interest in sex, if you are male. Be unable to have an erection, if you are male. How is this treated?  Sleeping on your side. Using a medicine to get rid of mucus in your nose (decongestant). Avoiding the use of alcohol, medicines to help you relax, or certain pain medicines (narcotics). Losing weight, if needed. Changing your diet. Quitting smoking. Using a machine to open your airway while you sleep, such as: An oral appliance. This is a mouthpiece that shifts your lower jaw forward. A CPAP device. This device blows air through a mask when you breathe out (exhale). An EPAP device. This has valves that you put in each nostril. A BPAP device. This device blows air through a mask when you breathe in (inhale) and breathe out. Having surgery if other treatments do not work. Follow these instructions at home: Lifestyle Make changes that your doctor recommends. Eat a healthy diet. Lose weight if needed. Avoid alcohol, medicines to help you relax, and some pain medicines. Do not smoke or use any products that contain nicotine or tobacco. If you need help quitting, ask your doctor. General instructions Take over-the-counter and prescription medicines only as told by your doctor. If you were given a machine to use while you sleep, use it only as told by your doctor. If you are having surgery, make sure to tell your doctor you have sleep apnea. You may need to bring your device with you. Keep all follow-up visits. Contact a doctor if: The machine that you were given to use during sleep bothers you or does not seem to be working. You do not get  better. You get worse. Get help right away if: Your chest hurts. You have trouble breathing in enough air. You have an uncomfortable feeling in your back, arms, or stomach. You have trouble talking. One side of your body feels weak. A part of your face is hanging down. These symptoms may be an emergency. Get help right away. Call your  local emergency services (911 in the [place] Do not wait to see if the symptoms will go away. Do not drive yourself to the hospital. Summary This condition affects breathing during sleep. The most common cause is a collapsed or blocked airway. The goal of treatment is to help you breathe normally while you sleep. This information is not intended to replace advice given to you by your health care provider. Make sure you discuss any questions you have with your health care provider. Document Revised: 02/18/2020 Document Reviewed: 02/18/2020 Elsevier Patient Education  2022 [employer].

## 2021-01-26 LAB — MICROSCOPIC EXAMINATION
Bacteria, UA: NONE SEEN
Epithelial Cells (non renal): NONE SEEN /hpf (ref 0–10)
RBC: NONE SEEN /hpf (ref 0–2)
WBC, UA: NONE SEEN /hpf (ref 0–5)

## 2021-01-26 LAB — URINALYSIS, COMPLETE
Bilirubin, UA: NEGATIVE
Glucose, UA: NEGATIVE
Ketones, UA: NEGATIVE
Leukocytes,UA: NEGATIVE
Nitrite, UA: NEGATIVE
Protein,UA: NEGATIVE
RBC, UA: NEGATIVE
Specific Gravity, UA: 1.02 (ref 1.005–1.030)
Urobilinogen, Ur: 0.2 mg/dL (ref 0.2–1.0)
pH, UA: 7 (ref 5.0–7.5)

## 2021-02-12 ENCOUNTER — Ambulatory Visit: Payer: No Typology Code available for payment source | Admitting: Surgery

## 2021-04-16 ENCOUNTER — Ambulatory Visit: Payer: No Typology Code available for payment source | Admitting: Family Medicine

## 2021-07-25 ENCOUNTER — Ambulatory Visit: Payer: No Typology Code available for payment source | Admitting: Urology

## 2021-08-30 ENCOUNTER — Ambulatory Visit: Payer: No Typology Code available for payment source | Admitting: Urology

## 2021-08-31 ENCOUNTER — Encounter: Payer: Self-pay | Admitting: Urology

## 2021-09-05 ENCOUNTER — Ambulatory Visit: Payer: Self-pay | Admitting: Physician Assistant

## 2021-09-05 ENCOUNTER — Encounter: Payer: Self-pay | Admitting: Physician Assistant

## 2021-09-05 VITALS — BP 111/73 | HR 92 | Ht 66.0 in | Wt 168.0 lb

## 2021-09-05 DIAGNOSIS — Z0289 Encounter for other administrative examinations: Secondary | ICD-10-CM

## 2021-09-05 DIAGNOSIS — Z0000 Encounter for general adult medical examination without abnormal findings: Secondary | ICD-10-CM

## 2021-09-05 NOTE — Progress Notes (Signed)
Established Patient Office Visit  Subjective   Patient ID: Glenn Kelly, male    DOB: 1971-02-26  Age: 51 y.o. MRN: [id number]  Chief Complaint  Patient presents with   paperwork     States that he is hoping to be able to participate in plasma donation, states that he was given a form to have completed by his primary care provider stating reason he had open heart surgery at 51 years of age.  States he was unable to be seen by them in a timely fashion.  States that he does not know the reason for his surgery, states that it was completed in [place].  States he does not have any medical records.  No other concerns at this time    History reviewed. No pertinent past medical history. Past Surgical History:  Procedure Laterality Date   congenital heart      EYE SURGERY     Social History   Socioeconomic History   Marital status: Single    Spouse name: Not on file   Number of children: Not on file   Years of education: Not on file   Highest education level: Not on file  Occupational History   Not on file  Tobacco Use   Smoking status: Never   Smokeless tobacco: Never  Vaping Use   Vaping Use: Never used  Substance and Sexual Activity   Alcohol use: No   Drug use: No   Sexual activity: Yes  Other Topics Concern   Not on file  Social History Narrative   Not on file   Social Determinants of Health   Financial Resource Strain: Not on file  Food Insecurity: Not on file  Transportation Needs: Not on file  Physical Activity: Not on file  Stress: Not on file  Social Connections: Not on file  Intimate Partner Violence: Not on file   History reviewed. No pertinent family history. No Known Allergies    Review of Systems  Constitutional: Negative.   HENT: Negative.    Eyes: Negative.   Respiratory:  Negative for shortness of breath.   Cardiovascular:  Negative for chest pain.  Gastrointestinal: Negative.   Genitourinary: Negative.   Musculoskeletal:  Negative.   Skin: Negative.   Neurological: Negative.   Endo/Heme/Allergies: Negative.   Psychiatric/Behavioral: Negative.        Objective:     BP 111/73 (BP Location: Left Arm)   Pulse 92   Ht 5\' 6"  (1.676 m)   Wt 168 lb (76.2 kg)   SpO2 97%   BMI 27.12 kg/m    Physical Exam Vitals and nursing note reviewed.  Constitutional:      Appearance: Normal appearance.  HENT:     Head: Normocephalic and atraumatic.     Right Ear: External ear normal.     Left Ear: External ear normal.     Nose: Nose normal.     Mouth/Throat:     Mouth: Mucous membranes are moist.     Pharynx: Oropharynx is clear.  Eyes:     Extraocular Movements: Extraocular movements intact.     Conjunctiva/sclera: Conjunctivae normal.     Pupils: Pupils are equal, round, and reactive to light.  Cardiovascular:     Rate and Rhythm: Normal rate and regular rhythm.     Pulses: Normal pulses.     Heart sounds: Normal heart sounds.  Pulmonary:     Effort: Pulmonary effort is normal.     Breath sounds: Normal breath sounds.  Musculoskeletal:        General: Normal range of motion.     Cervical back: Normal range of motion and neck supple.  Skin:    General: Skin is warm and dry.  Neurological:     General: No focal deficit present.     Mental Status: He is alert and oriented to person, place, and time.  Psychiatric:        Mood and Affect: Mood normal.        Behavior: Behavior normal.        Thought Content: Thought content normal.        Judgment: Judgment normal.        Assessment & Plan:   Problem List Items Addressed This Visit   None Visit Diagnoses     Physical exam, routine    -  Primary     1. Physical exam, routine Reviewed lab results with patient from October 2022.  Unable to locate any records in patient's chart for previous heart surgery.  Encouraged patient to reach out to [facility] to try and locate his records.  Patient understands and agrees.  Form filled out on  patient's behalf.   I have reviewed the patient's medical history (PMH, PSH, Social History, Family History, Medications, and allergies) , and have been updated if relevant. I spent 15 minutes reviewing chart and  face to face time with patient.     Return if symptoms worsen or fail to improve.    Kasandra Knudsen Mayers, PA-C

## 2021-09-05 NOTE — Patient Instructions (Signed)
Health Maintenance, Male Adopting a healthy lifestyle and getting preventive care are important in promoting health and wellness. Ask your health care provider about: The right schedule for you to have regular tests and exams. Things you can do on your own to prevent diseases and keep yourself healthy. What should I know about diet, weight, and exercise? Eat a healthy diet  Eat a diet that includes plenty of vegetables, fruits, low-fat dairy products, and lean protein. Do not eat a lot of foods that are high in solid fats, added sugars, or sodium. Maintain a healthy weight Body mass index (BMI) is a measurement that can be used to identify possible weight problems. It estimates body fat based on height and weight. Your health care provider can help determine your BMI and help you achieve or maintain a healthy weight. Get regular exercise Get regular exercise. This is one of the most important things you can do for your health. Most adults should: Exercise for at least 150 minutes each week. The exercise should increase your heart rate and make you sweat (moderate-intensity exercise). Do strengthening exercises at least twice a week. This is in addition to the moderate-intensity exercise. Spend less time sitting. Even light physical activity can be beneficial. Watch cholesterol and blood lipids Have your blood tested for lipids and cholesterol at 51 years of age, then have this test every 5 years. You may need to have your cholesterol levels checked more often if: Your lipid or cholesterol levels are high. You are older than 51 years of age. You are at high risk for heart disease. What should I know about cancer screening? Many types of cancers can be detected early and may often be prevented. Depending on your health history and family history, you may need to have cancer screening at various ages. This may include screening for: Colorectal cancer. Prostate cancer. Skin cancer. Lung  cancer. What should I know about heart disease, diabetes, and high blood pressure? Blood pressure and heart disease High blood pressure causes heart disease and increases the risk of stroke. This is more likely to develop in people who have high blood pressure readings or are overweight. Talk with your health care provider about your target blood pressure readings. Have your blood pressure checked: Every 3-5 years if you are 18-39 years of age. Every year if you are 40 years old or older. If you are between the ages of 65 and 75 and are a current or former smoker, ask your health care provider if you should have a one-time screening for abdominal aortic aneurysm (AAA). Diabetes Have regular diabetes screenings. This checks your fasting blood sugar level. Have the screening done: Once every three years after age 45 if you are at a normal weight and have a low risk for diabetes. More often and at a younger age if you are overweight or have a high risk for diabetes. What should I know about preventing infection? Hepatitis B If you have a higher risk for hepatitis B, you should be screened for this virus. Talk with your health care provider to find out if you are at risk for hepatitis B infection. Hepatitis C Blood testing is recommended for: Everyone born from 1945 through 1965. Anyone with known risk factors for hepatitis C. Sexually transmitted infections (STIs) You should be screened each year for STIs, including gonorrhea and chlamydia, if: You are sexually active and are younger than 51 years of age. You are older than 51 years of age and your   health care provider tells you that you are at risk for this type of infection. Your sexual activity has changed since you were last screened, and you are at increased risk for chlamydia or gonorrhea. Ask your health care provider if you are at risk. Ask your health care provider about whether you are at high risk for HIV. Your health care provider  may recommend a prescription medicine to help prevent HIV infection. If you choose to take medicine to prevent HIV, you should first get tested for HIV. You should then be tested every 3 months for as long as you are taking the medicine. Follow these instructions at home: Alcohol use Do not drink alcohol if your health care provider tells you not to drink. If you drink alcohol: Limit how much you have to 0-2 drinks a day. Know how much alcohol is in your drink. In the [place] one drink equals one 12 oz bottle of beer (355 mL), one 5 oz glass of wine (148 mL), or one 1 oz glass of hard liquor (44 mL). Lifestyle Do not use any products that contain nicotine or tobacco. These products include cigarettes, chewing tobacco, and vaping devices, such as e-cigarettes. If you need help quitting, ask your health care provider. Do not use street drugs. Do not share needles. Ask your health care provider for help if you need support or information about quitting drugs. General instructions Schedule regular health, dental, and eye exams. Stay current with your vaccines. Tell your health care provider if: You often feel depressed. You have ever been abused or do not feel safe at home. Summary Adopting a healthy lifestyle and getting preventive care are important in promoting health and wellness. Follow your health care provider's instructions about healthy diet, exercising, and getting tested or screened for diseases. Follow your health care provider's instructions on monitoring your cholesterol and blood pressure. This information is not intended to replace advice given to you by your health care provider. Make sure you discuss any questions you have with your health care provider. Document Revised: 07/31/2020 Document Reviewed: 07/31/2020 Elsevier Patient Education  2023 Elsevier Inc.  

## 2021-09-26 ENCOUNTER — Ambulatory Visit: Payer: No Typology Code available for payment source | Admitting: Physician Assistant

## 2022-03-21 ENCOUNTER — Other Ambulatory Visit: Payer: Self-pay

## 2022-03-21 DIAGNOSIS — K4090 Unilateral inguinal hernia, without obstruction or gangrene, not specified as recurrent: Secondary | ICD-10-CM

## 2022-03-25 ENCOUNTER — Ambulatory Visit (HOSPITAL_COMMUNITY)
Admission: EM | Admit: 2022-03-25 | Discharge: 2022-03-25 | Disposition: A | Discharge status: Home / Self Care | Payer: PRIVATE HEALTH INSURANCE | Attending: Internal Medicine | Admitting: Internal Medicine

## 2022-03-25 ENCOUNTER — Encounter (HOSPITAL_COMMUNITY): Payer: Self-pay

## 2022-03-25 DIAGNOSIS — K6289 Other specified diseases of anus and rectum: Secondary | ICD-10-CM

## 2022-03-25 MED ORDER — HYDROCORTISONE ACETATE 25 MG RE SUPP: 25.0000 mg | Freq: Two times a day (BID) | RECTAL | 0 refills | Status: AC

## 2022-03-25 NOTE — ED Provider Notes (Addendum)
[facility]    CSN: [id number] Arrival date & time: 03/25/22  1639      History   Chief Complaint Chief Complaint  Patient presents with   Constipation    HPI Glenn Kelly is a 52 y.o. male comes to the urgent care with 1 week history of discomfort with bowel movements.  Patient's symptoms have been persistent over the past week.  Bowel movement is associated with discomfort.  No rectal masses.  Patient endorses some bright red blood on the toilet paper.  Patient denies any blood on the stool or mixed with the stool.  No abdominal pain or bloating.  No fever or chills. HPI  History reviewed. No pertinent past medical history.  There are no problems to display for this patient.   Past Surgical History:  Procedure Laterality Date   congenital heart      EYE SURGERY         Home Medications    Prior to Admission medications   Medication Sig Start Date End Date Taking? Authorizing Provider  hydrocortisone (ANUSOL-HC) 25 MG suppository Place 1 suppository (25 mg total) rectally 2 (two) times daily. 03/25/22  Yes Stedman [place], Myrene Galas, MD    Family History History reviewed. No pertinent family history.  Social History Social History   Tobacco Use   Smoking status: Never   Smokeless tobacco: Never  Vaping Use   Vaping Use: Never used  Substance Use Topics   Alcohol use: No   Drug use: No     Allergies   Patient has no known allergies.   Review of Systems Review of Systems As per HPI  Physical Exam Triage Vital Signs ED Triage Vitals  Enc Vitals Group     BP 03/25/22 1722 (!) 121/94     Pulse Rate 03/25/22 1722 82     Resp 03/25/22 1722 16     Temp 03/25/22 1722 98 F (36.7 C)     Temp Source 03/25/22 1722 Oral     SpO2 03/25/22 1722 98 %     Weight --      Height --      Head Circumference --      Peak Flow --      Pain Score 03/25/22 1719 3     Pain Loc --      Pain Edu? --      Excl. in [place]? --    No data found.  Updated Vital  Signs BP (!) 121/94 (BP Location: Right Arm)   Pulse 82   Temp 98 F (36.7 C) (Oral)   Resp 16   SpO2 98%   Visual Acuity Right Eye Distance:   Left Eye Distance:   Bilateral Distance:    Right Eye Near:   Left Eye Near:    Bilateral Near:     Physical Exam Vitals and nursing note reviewed. Exam conducted with a chaperone present.  Constitutional:      General: He is not in acute distress.    Appearance: He is not ill-appearing.  HENT:     Right Ear: Tympanic membrane normal.     Left Ear: Tympanic membrane normal.  Cardiovascular:     Rate and Rhythm: Normal rate and regular rhythm.     Pulses: Normal pulses.     Heart sounds: Normal heart sounds.  Pulmonary:     Effort: Pulmonary effort is normal.     Breath sounds: Normal breath sounds.  Abdominal:     General:  Bowel sounds are normal.     Palpations: Abdomen is soft.  Genitourinary:    Comments: No hemorrhoids seen on rectal exam. Neurological:     Mental Status: He is alert.      UC Treatments / Results  Labs (all labs ordered are listed, but only abnormal results are displayed) Labs Reviewed - No data to display  EKG   Radiology No results found.  Procedures Procedures (including critical care time)  Medications Ordered in UC Medications - No data to display  Initial Impression / Assessment and Plan / UC Course  I have reviewed the triage vital signs and the nursing notes.  Pertinent labs & imaging results that were available during my care of the patient were reviewed by me and considered in my medical decision making (see chart for details).     1.  Rectal pain: Rectal pain/discomfort is likely related to internal hemorrhoids or anal fissure. Anusol suppository Increase fruit and vegetable intake Increase oral fluid intake Patient is encouraged to establish primary care provider and get a colonoscopy done. Return precautions given. Final Clinical Impressions(s) / UC Diagnoses   Final  diagnoses:  Anal or rectal pain     Discharge Instructions      Increase fiber and vegetable intake Increase oral fluid intake Use medications as directed If you have worsening pain please return to urgent care to be evaluated You are due for colonoscopy.  This can be arranged through your primary care physician.  The urgent care team will help you find a primary care physician.   ED Prescriptions     Medication Sig Dispense Auth. Provider   hydrocortisone (ANUSOL-HC) 25 MG suppository Place 1 suppository (25 mg total) rectally 2 (two) times daily. 12 suppository Pranit Owensby, Myrene Galas, MD      PDMP not reviewed this encounter.   Chase Picket, MD 03/25/22 Greer Ee    Chase Picket, MD 03/25/22 [phone]

## 2022-03-25 NOTE — ED Triage Notes (Signed)
Pt is here for some discomfort when making a bowel movement  x1wk

## 2022-03-25 NOTE — Discharge Instructions (Addendum)
Increase fiber and vegetable intake Increase oral fluid intake Use medications as directed If you have worsening pain please return to urgent care to be evaluated You are due for colonoscopy.  This can be arranged through your primary care physician.  The urgent care team will help you find a primary care physician.

## 2022-04-03 ENCOUNTER — Emergency Department (HOSPITAL_COMMUNITY)
Admission: EM | Admit: 2022-04-03 | Discharge: 2022-04-03 | Disposition: A | Discharge status: Home / Self Care | Payer: PRIVATE HEALTH INSURANCE | Attending: Student | Admitting: Student

## 2022-04-03 ENCOUNTER — Emergency Department (HOSPITAL_BASED_OUTPATIENT_CLINIC_OR_DEPARTMENT_OTHER)
Admit: 2022-04-03 | Discharge: 2022-04-03 | Disposition: A | Discharge status: Home / Self Care | Payer: PRIVATE HEALTH INSURANCE

## 2022-04-03 ENCOUNTER — Encounter (HOSPITAL_COMMUNITY): Payer: Self-pay

## 2022-04-03 DIAGNOSIS — R52 Pain, unspecified: Secondary | ICD-10-CM

## 2022-04-03 DIAGNOSIS — M79662 Pain in left lower leg: Secondary | ICD-10-CM | POA: Diagnosis not present

## 2022-04-03 LAB — CBC WITH DIFFERENTIAL/PLATELET
Abs Immature Granulocytes: 0.02 10*3/uL (ref 0.00–0.07)
Basophils Absolute: 0.1 10*3/uL (ref 0.0–0.1)
Basophils Relative: 1 %
Eosinophils Absolute: 0 10*3/uL (ref 0.0–0.5)
Eosinophils Relative: 0 %
HCT: 48.2 % (ref 39.0–52.0)
Hemoglobin: 16 g/dL (ref 13.0–17.0)
Immature Granulocytes: 0 %
Lymphocytes Relative: 16 %
Lymphs Abs: 1 10*3/uL (ref 0.7–4.0)
MCH: 30.8 pg (ref 26.0–34.0)
MCHC: 33.2 g/dL (ref 30.0–36.0)
MCV: 92.9 fL (ref 80.0–100.0)
Monocytes Absolute: 0.5 10*3/uL (ref 0.1–1.0)
Monocytes Relative: 7 %
Neutro Abs: 4.6 10*3/uL (ref 1.7–7.7)
Neutrophils Relative %: 76 %
Platelets: 305 10*3/uL (ref 150–400)
RBC: 5.19 MIL/uL (ref 4.22–5.81)
RDW: 11.4 % — ABNORMAL LOW (ref 11.5–15.5)
WBC: 6.2 10*3/uL (ref 4.0–10.5)
nRBC: 0 % (ref 0.0–0.2)

## 2022-04-03 LAB — BASIC METABOLIC PANEL
Anion gap: 6 (ref 5–15)
BUN: 14 mg/dL (ref 6–20)
CO2: 27 mmol/L (ref 22–32)
Calcium: 8.9 mg/dL (ref 8.9–10.3)
Chloride: 106 mmol/L (ref 98–111)
Creatinine, Ser: 0.77 mg/dL (ref 0.61–1.24)
GFR, Estimated: >60 mL/min (ref >60)
Glucose, Bld: 100 mg/dL — ABNORMAL HIGH (ref 70–99)
Potassium: 4 mmol/L (ref 3.5–5.1)
Sodium: 139 mmol/L (ref 135–145)

## 2022-04-03 LAB — D-DIMER, QUANTITATIVE: D-Dimer, Quant: <0.27 ug/mL-FEU (ref 0.00–0.50)

## 2022-04-03 NOTE — Discharge Instructions (Addendum)
Please read and follow all provided instructions.  Your diagnoses today include:  1. Pain of left calf     Tests performed today include: Complete blood cell count: no problems Basic metabolic panel: no problems D-dimer screening test for clot: negative Ultrasound of the left leg: no signs of blood clot or other problem Vital signs. See below for your results today.   Medications prescribed:  None  Take any prescribed medications only as directed.  Home care instructions:  Follow any educational materials contained in this packet Follow R.I.C.E. Protocol: R - rest your injury  I  - use ice on injury without applying directly to skin C - compress injury with bandage or splint E - elevate the injury as much as possible  Follow-up instructions: Please follow-up with your primary care provider or the provided orthopedic physician (bone specialist) if you continue to have significant pain in 1 week. In this case you may have a more severe injury that requires further care.   Return instructions:  Please return if your toes or feet are numb or tingling, appear gray or blue, or you have severe pain (also elevate the leg and loosen splint or wrap if you were given one) Please return to the Emergency Department if you experience worsening symptoms.  Please return if you have any other emergent concerns.  Additional Information:  Your vital signs today were: BP 138/85 (BP Location: Left Arm)   Pulse 84   Temp 98.3 F (36.8 C) (Oral)   Resp 16   SpO2 97%  If your blood pressure (BP) was elevated above 135/85 this visit, please have this repeated by your doctor within one month.

## 2022-04-03 NOTE — Progress Notes (Signed)
Left lower extremity venous duplex has been completed. Preliminary results can be found in CV Proc through chart review.  Results were given to Carlisle Cater PA.  04/03/22 3:06 PM Glenn Kelly RVT

## 2022-04-03 NOTE — ED Triage Notes (Signed)
Pt arrived via EMS[facility], c/o left calf pain. Has been seen for same recently, pain worsening today.

## 2022-04-03 NOTE — ED Provider Triage Note (Signed)
Emergency Medicine Provider Triage Evaluation Note  Glenn Kelly , a 52 y.o. male  was evaluated in triage.  Pt complains of left calf pain for the last week. 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 Denies redness, swelling. States it hurts at all times. Was seen at [facility] and told to come to ED. Denies chest pain and SOB. Xray at UC negative.   Review of Systems  Positive: Leg pain Negative: Chest pain  Physical Exam  BP 138/85 (BP Location: Left Arm)   Pulse 84   Temp 98.3 F (36.8 C) (Oral)   Resp 16   SpO2 97%  Gen:   Awake, no distress   Resp:  Normal effort  MSK:   Moves extremities without difficulty  Other:  +TTP of Left calf, no rash noted  Medical Decision Making  Medically screening exam initiated at 12:31 PM.  Appropriate orders placed.  Eldar D Bushart was informed that the remainder of the evaluation will be completed by another provider, this initial triage assessment does not replace that evaluation, and the importance of remaining in the ED until their evaluation is complete.     Osvaldo Shipper, [place] 04/03/22 1233

## 2022-04-03 NOTE — ED Provider Notes (Signed)
[facility]-EMERGENCY DEPT Provider Note   CSN: [id number] Arrival date & time: 04/03/22  1157     History  Chief Complaint  Patient presents with   Leg Pain    Glenn Kelly is a 52 y.o. male.  Patient presents to the emergency department today for evaluation of left calf pain, described as a deep "throbbing" pain.  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  No weakness, numbness, or tingling.  Patient has had an x-ray as an outpatient reportedly which was negative.  He was started on gabapentin and tramadol.  On follow-up today, referred to the emergency department for rule out of DVT.  Patient does not have a history of DVT.  He was told to stop taking the gabapentin to and was given additional tramadol.  No history of seizures. Patient denies other risk factors for DVT including: unilateral leg swelling, history of DVT/PE/other blood clots, use of exogenous hormones, recent immobilizations, recent surgery, recent travel (>4hr segment), malignancy.          Home Medications Prior to Admission medications   Medication Sig Start Date End Date Taking? Authorizing Provider  hydrocortisone (ANUSOL-HC) 25 MG suppository Place 1 suppository (25 mg total) rectally 2 (two) times daily. 03/25/22   Lamptey, Britta Mccreedy, MD      Allergies    Patient has no known allergies.    Review of Systems   Review of Systems  Physical Exam Updated Vital Signs BP 138/85 (BP Location: Left Arm)   Pulse 84   Temp 98.3 F (36.8 C) (Oral)   Resp 16   SpO2 97%   Physical Exam Vitals and nursing note reviewed.  Constitutional:      Appearance: He is well-developed.  HENT:     Head: Normocephalic and atraumatic.  Eyes:     Conjunctiva/sclera: Conjunctivae normal.  Cardiovascular:     Pulses: Normal pulses. No decreased pulses.          Dorsalis pedis pulses are 2+ on the left side.  Musculoskeletal:        General: Tenderness present.     Cervical back:  Normal range of motion and neck supple.     Right lower leg: No edema.     Left lower leg: No edema.  Skin:    General: Skin is warm and dry.  Neurological:     Mental Status: He is alert.     Sensory: No sensory deficit.     Comments: Motor, sensation, and vascular distal to the injury is fully intact.   Psychiatric:        Mood and Affect: Mood normal.     ED Results / Procedures / Treatments   Labs (all labs ordered are listed, but only abnormal results are displayed) Labs Reviewed  CBC WITH DIFFERENTIAL/PLATELET - Abnormal; Notable for the following components:      Result Value   RDW 11.4 (*)    All other components within normal limits  BASIC METABOLIC PANEL - Abnormal; Notable for the following components:   Glucose, Bld 100 (*)    All other components within normal limits  D-DIMER, QUANTITATIVE    EKG None  Radiology VAS [place] LOWER EXTREMITY VENOUS (DVT) (7a-7p)  Result Date: 04/03/2022  Lower Venous DVT Study Patient Name:  Glenn Kelly  Date of Exam:   04/03/2022 Medical Rec #: [id number]        Accession #:    [id number] Date of Birth: February 18, 1971  Patient Gender: M Patient Age:   77 years Exam Location:  [facility] Procedure:      VAS [place] LOWER EXTREMITY VENOUS (DVT) Referring Phys: Malaisha Silliman --------------------------------------------------------------------------------  Indications: Pain.  Risk Factors: None identified. Comparison Study: No prior studies. Performing Technologist: Oliver Hum RVT  Examination Guidelines: A complete evaluation includes B-mode imaging, spectral Doppler, color Doppler, and power Doppler as needed of all accessible portions of each vessel. Bilateral testing is considered an integral part of a complete examination. Limited examinations for reoccurring indications may be performed as noted. The reflux portion of the exam is performed with the patient in reverse Trendelenburg.   +-----+---------------+---------+-----------+----------+--------------+ RIGHTCompressibilityPhasicitySpontaneityPropertiesThrombus Aging +-----+---------------+---------+-----------+----------+--------------+ CFV  Full           Yes      Yes                                 +-----+---------------+---------+-----------+----------+--------------+   +---------+---------------+---------+-----------+----------+--------------+ LEFT     CompressibilityPhasicitySpontaneityPropertiesThrombus Aging +---------+---------------+---------+-----------+----------+--------------+ CFV      Full           Yes      Yes                                 +---------+---------------+---------+-----------+----------+--------------+ SFJ      Full                                                        +---------+---------------+---------+-----------+----------+--------------+ FV Prox  Full                                                        +---------+---------------+---------+-----------+----------+--------------+ FV Mid   Full                                                        +---------+---------------+---------+-----------+----------+--------------+ FV DistalFull                                                        +---------+---------------+---------+-----------+----------+--------------+ PFV      Full                                                        +---------+---------------+---------+-----------+----------+--------------+ POP      Full           Yes      Yes                                 +---------+---------------+---------+-----------+----------+--------------+ PTV  Full                                                        +---------+---------------+---------+-----------+----------+--------------+ PERO     Full                                                         +---------+---------------+---------+-----------+----------+--------------+     Summary: RIGHT: - No evidence of common femoral vein obstruction.  LEFT: - There is no evidence of deep vein thrombosis in the lower extremity.  - No cystic structure found in the popliteal fossa.  *See table(s) above for measurements and observations.    Preliminary     Procedures Procedures    Medications Ordered in ED Medications - No data to display  ED Course/ Medical Decision Making/ A&P    Patient seen and examined. History obtained directly from patient. Work-up including labs, imaging, EKG ordered in triage, if performed, were reviewed.    Labs/EKG: Independently reviewed and interpreted.  This included: CBC unremarkable; BMP unremarkable; D-dimer normal.  Imaging: Ordered DVT study.  Medications/Fluids: None ordered.  Most recent vital signs reviewed and are as follows: BP 138/85 (BP Location: Left Arm)   Pulse 84   Temp 98.3 F (36.8 C) (Oral)   Resp 16   SpO2 97%   Initial impression: left lower extremity pain.   3:13 PM Reassessment performed. Patient appears stable.  Plan: Discharge to home.   Prescriptions written for: None  Other home care instructions discussed: Continue previously prescribed medications.  ED return instructions discussed: Return with worsening uncontrolled pain, fever, worsening swelling.  Follow-up instructions discussed: Patient encouraged to follow-up with their PCP/sports medicine.  Referral given if needed.                          Medical Decision Making  Left lower extremity pain, likely musculoskeletal.  Reports x-ray performed which was negative.  Labs okay today as well as lower extremity DVT study negative for DVT.  Patient is on tramadol.  Encouraged rest, outpatient follow-up as planned.  The patient's vital signs, pertinent lab work and imaging were reviewed and interpreted as discussed in the ED course. Hospitalization was considered for  further testing, treatments, or serial exams/observation. However as patient is well-appearing, has a stable exam, and reassuring studies today, I do not feel that they warrant admission at this time. This plan was discussed with the patient who verbalizes agreement and comfort with this plan and seems reliable and able to return to the Emergency Department with worsening or changing symptoms.          Final Clinical Impression(s) / ED Diagnoses Final diagnoses:  Pain of left calf    Rx / DC Orders ED Discharge Orders     None         Carlisle Cater, PA-C 04/03/22 [place], MD 04/03/22 2136

## 2022-04-05 ENCOUNTER — Other Ambulatory Visit: Payer: Self-pay

## 2022-04-05 ENCOUNTER — Ambulatory Visit (INDEPENDENT_AMBULATORY_CARE_PROVIDER_SITE_OTHER): Payer: PRIVATE HEALTH INSURANCE | Admitting: Surgery

## 2022-04-05 ENCOUNTER — Encounter: Payer: Self-pay | Admitting: Surgery

## 2022-04-05 VITALS — BP 120/77 | HR 96 | Temp 98.2°F | Ht 66.0 in | Wt 164.0 lb

## 2022-04-05 DIAGNOSIS — K4090 Unilateral inguinal hernia, without obstruction or gangrene, not specified as recurrent: Secondary | ICD-10-CM

## 2022-04-05 NOTE — Patient Instructions (Addendum)
Please call our office when you decide to move forward with surgery.     Our surgery scheduler will call you within 24-48 hours to schedule your surgery. Please have the [facility] surgery sheet available when speaking with her.     Inguinal Hernia, Adult An inguinal hernia develops when fat or the intestines push through a weak spot in a muscle where the leg meets the lower abdomen (groin). This creates a bulge. This kind of hernia could also be: In the scrotum, if you are male. In folds of skin around the vagina, if you are male. There are three types of inguinal hernias: Hernias that can be pushed back into the abdomen (are reducible). This type rarely causes pain. Hernias that are not reducible (are incarcerated). Hernias that are not reducible and lose their blood supply (are strangulated). This type of hernia requires emergency surgery. What are the causes? This condition is caused by having a weak spot in the muscles or tissues in your groin. This develops over time. The hernia may poke through the weak spot when you suddenly strain your lower abdominal muscles, such as when you: Lift a heavy object. Strain to have a bowel movement. Constipation can lead to straining. Cough. What increases the risk? This condition is more likely to develop in: Males. Pregnant females. People who: Are overweight. Work in jobs that require long periods of standing or heavy lifting. Have had an inguinal hernia before. Smoke or have lung disease. These factors can lead to long-term (chronic) coughing. What are the signs or symptoms? Symptoms may depend on the size of the hernia. Often, a small inguinal hernia has no symptoms. Symptoms of a larger hernia may include: A bulge in the groin area. This is easier to see when standing. It might not be visible when lying down. Pain or burning in the groin. This may get worse when lifting, straining, or coughing. A dull ache or a feeling of pressure in the  groin. An unusual bulge in the scrotum, in males. Symptoms of a strangulated inguinal hernia may include: A bulge in your groin that is very painful and tender to the touch. A bulge that turns red or purple. Fever, nausea, and vomiting. Inability to have a bowel movement or to pass gas. How is this diagnosed? This condition is diagnosed based on your symptoms, your medical history, and a physical exam. Your health care provider may feel your groin area and ask you to cough. How is this treated? Treatment depends on the size of your hernia and whether you have symptoms. If you do not have symptoms, your health care provider may have you watch your hernia carefully and have you come in for follow-up visits. If your hernia is large or if you have symptoms, you may need surgery to repair the hernia. Follow these instructions at home: Lifestyle Avoid lifting heavy objects. Avoid standing for long periods of time. Do not use any products that contain nicotine or tobacco. These products include cigarettes, chewing tobacco, and vaping devices, such as e-cigarettes. If you need help quitting, ask your health care provider. Maintain a healthy weight. Preventing constipation You may need to take these actions to prevent or treat constipation: Drink enough fluid to keep your urine pale yellow. Take over-the-counter or prescription medicines. Eat foods that are high in fiber, such as beans, whole grains, and fresh fruits and vegetables. Limit foods that are high in fat and processed sugars, such as fried or sweet foods. General instructions You  may try to push the hernia back in place by very gently pressing on it while lying down. Do not try to force the bulge back in if it will not push in easily. Watch your hernia for any changes in shape, size, or color. Get help right away if you notice any changes. Take over-the-counter and prescription medicines only as told by your health care provider. Keep  all follow-up visits. This is important. Contact a health care provider if: You have a fever or chills. You develop new symptoms. Your symptoms get worse. Get help right away if: You have pain in your groin that suddenly gets worse. You have a bulge in your groin that: Suddenly gets bigger and does not get smaller. Becomes red or purple or painful to the touch. You are a man and you have a sudden pain in your scrotum, or the size of your scrotum suddenly changes. You cannot push the hernia back in place by very gently pressing on it when you are lying down. You have nausea or vomiting that does not go away. You have a fast heartbeat. You cannot have a bowel movement or pass gas. These symptoms may represent a serious problem that is an emergency. Do not wait to see if the symptoms will go away. Get medical help right away. Call your local emergency services (911 in the [place] Summary An inguinal hernia develops when fat or the intestines push through a weak spot in a muscle where your leg meets your lower abdomen (groin). This condition is caused by having a weak spot in muscles or tissues in your groin. Symptoms may depend on the size of the hernia, and they may include pain or swelling in your groin. A small inguinal hernia often has no symptoms. Treatment may not be needed if you do not have symptoms. If you have symptoms or a large hernia, you may need surgery to repair the hernia. Avoid lifting heavy objects. Also, avoid standing for long periods of time. This information is not intended to replace advice given to you by your health care provider. Make sure you discuss any questions you have with your health care provider. Document Revised: 11/09/2019 Document Reviewed: 11/09/2019 Elsevier Patient Education  [employer].

## 2022-04-05 NOTE — Progress Notes (Unsigned)
04/05/2022  Reason for Visit:  Right inguinal hernia  Requesting Provider:  Nickolas Madrid, MD  History of Present Illness: Glenn Kelly is a 52 y.o. male presenting for evaluation of a possible right inguinal hernia.  He presented to the ED on 12/21/20 with urinary frequency and right scrotal swelling.  He was referred to Urology and was seen by Dr. Diamantina Providence on 01/25/21.  He was diagnosed with a possible right inguinal hernia and was referred to [place] for further evaluation.  The patient reports swelling and discomfort in the right groin extending to the scrotum/testicle area.  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  The heavy lifting does cause more discomfort.  He does feel an area of bulging and reports that sometimes is extends more and sometimes is flatter, but it's never fully flat.  Denies any constipation, nausea, vomiting.  Has not had any abdominal surgeries in the past.  Past Medical History: History reviewed. No pertinent past medical history.   Past Surgical History: Past Surgical History:  Procedure Laterality Date   congenital heart      EYE SURGERY      Home Medications: Prior to Admission medications   Medication Sig Start Date End Date Taking? Authorizing Provider  hydrocortisone (ANUSOL-HC) 25 MG suppository Place 1 suppository (25 mg total) rectally 2 (two) times daily. 03/25/22   LampteyMyrene Galas, MD    Allergies: No Known Allergies  Social History:  reports that he has never smoked. He has never used smokeless tobacco. He reports that he does not drink alcohol and does not use drugs.   Family History: History reviewed. No pertinent family history.  Review of Systems: Review of Systems  Constitutional:  Negative for chills and fever.  HENT:  Negative for hearing loss.   Respiratory:  Negative for shortness of breath.   Cardiovascular:  Negative for chest pain.  Gastrointestinal:  Positive for abdominal pain (right groin/scrotum). Negative  for nausea and vomiting.  Genitourinary:  Negative for dysuria.  Musculoskeletal:  Negative for myalgias.  Skin:  Negative for rash.  Neurological:  Negative for dizziness.  Psychiatric/Behavioral:  Negative for depression.     Physical Exam BP 120/77   Pulse 96   Temp 98.2 F (36.8 C) (Oral)   Ht 5\' 6"  (1.676 m)   Wt 164 lb (74.4 kg)   SpO2 97%   BMI 26.47 kg/m  CONSTITUTIONAL: No acute distress, well nourished. HEENT:  Normocephalic, atraumatic, extraocular motion intact. NECK: Trachea is midline, and there is no jugular venous distension.  RESPIRATORY:  Lungs are clear, and breath sounds are equal bilaterally. Normal respiratory effort without pathologic use of accessory muscles. CARDIOVASCULAR: Heart is regular without murmurs, gallops, or rubs. GI: The abdomen is soft, non-distended, with discomfort to palpation in the right groin.  He does have a right inguinal hernia which extends to the right scrotum, which is only partially reducible, partially incarcerated. The contents are soft, without any overlying skin changes.  No palpable hernia defect in the left groin or at the umbilicus. MUSCULOSKELETAL:  Normal muscle strength and tone in all four extremities.  No peripheral edema or cyanosis. SKIN: Skin turgor is normal. There are no pathologic skin lesions.  NEUROLOGIC:  Motor and sensation is grossly normal.  Cranial nerves are grossly intact. PSYCH:  Alert and oriented to person, place and time. Affect is normal.  Laboratory Analysis: Labs from 04/03/22: Na 139, K 4, Cl 106 CO2 27, BUN 14, Cr 0.77.  WBC 6.2, Hgb 16, Hct 48.2, Plt 305.  Imaging: No results found.  Assessment and Plan: This is a 52 y.o. male with a right inguinal hernia.  --Discussed with the patient that he has an inguinal hernia which is incarcerated as it is not fully reducible.  His symptoms are more related to discomfort but he denies episodes of severe pain or episodes which the hernia contents are  hard/firm.  Discussed however that the recommendation would still be to repair the hernia and he's interested in repair as well.  Discussed with him the role for a robotic assisted right inguinal hernia repair and reviewed the surgery at length with him including the planned incisions, the risks of bleeding, infection, injury to surrounding structures, the ability to evaluate the left inguinal region and if a hernia is present there, also repair it, that this would be an outpatient surgery, post-operative activity restrictions, pain control, and he's willing to proceed. --He will check at work when would be the best timing to do surgery given his activity restrictions afterwards, and if any paperwork is needed such as FMLA.  He will then contact us to schedule surgery. --All of his questions have been answered.  I spent 40 minutes dedicated to the care of this patient on the date of this encounter to include pre-visit review of records, face-to-face time with the patient discussing diagnosis and management, and any post-visit coordination of care.   Melvyn Neth, [facility]

## 2022-04-09 ENCOUNTER — Telehealth: Payer: Self-pay | Admitting: Surgery

## 2022-04-09 NOTE — Telephone Encounter (Signed)
Spoke with patient to follow up and see if he was ready to schedule surgery with Dr. Hampton Abbot.  Michela Pitcher he has some other things he needs to focus on now and he will call us when ready.  Patient last saw Dr. Hampton Abbot in office on 04/05/22 for right inguinal hernia.  If more than 30 days when patient calls back, he will  need another follow up in office with the doctor.

## 2022-04-11 NOTE — Progress Notes (Signed)
Erroneous encounter-disregard

## 2022-04-16 ENCOUNTER — Encounter: Payer: PRIVATE HEALTH INSURANCE | Admitting: Family

## 2022-04-16 DIAGNOSIS — Z7689 Persons encountering health services in other specified circumstances: Secondary | ICD-10-CM

## 2022-05-14 ENCOUNTER — Telehealth: Payer: Self-pay | Admitting: Surgery

## 2022-05-14 NOTE — Telephone Encounter (Signed)
Called and spoke with patient to see if he was ready for scheduling of his surgery for inguinal hernia repair with Dr. Hampton Abbot.  Patient states that has some things going on with work right now and just not able to schedule at this time. Patient last saw Dr. Hampton Abbot in office on 04/05/22, if calls back and ready will also need follow up in office with Dr. Hampton Abbot.

## 2023-05-14 ENCOUNTER — Emergency Department (HOSPITAL_BASED_OUTPATIENT_CLINIC_OR_DEPARTMENT_OTHER): Payer: PRIVATE HEALTH INSURANCE

## 2023-05-14 ENCOUNTER — Emergency Department (HOSPITAL_BASED_OUTPATIENT_CLINIC_OR_DEPARTMENT_OTHER)
Admission: EM | Admit: 2023-05-14 | Discharge: 2023-05-14 | Disposition: A | Discharge status: Home / Self Care | Payer: PRIVATE HEALTH INSURANCE | Attending: Emergency Medicine | Admitting: Emergency Medicine

## 2023-05-14 ENCOUNTER — Encounter (HOSPITAL_BASED_OUTPATIENT_CLINIC_OR_DEPARTMENT_OTHER): Payer: Self-pay

## 2023-05-14 ENCOUNTER — Other Ambulatory Visit: Payer: Self-pay

## 2023-05-14 DIAGNOSIS — N410 Acute prostatitis: Secondary | ICD-10-CM | POA: Insufficient documentation

## 2023-05-14 DIAGNOSIS — K6289 Other specified diseases of anus and rectum: Secondary | ICD-10-CM | POA: Diagnosis present

## 2023-05-14 LAB — COMPREHENSIVE METABOLIC PANEL WITH GFR
ALT: 14 U/L (ref 0–44)
AST: 18 U/L (ref 15–41)
Albumin: 2.9 g/dL — ABNORMAL LOW (ref 3.5–5.0)
Alkaline Phosphatase: 46 U/L (ref 38–126)
Anion gap: 6 (ref 5–15)
BUN: 14 mg/dL (ref 6–20)
CO2: 25 mmol/L (ref 22–32)
Calcium: 7.9 mg/dL — ABNORMAL LOW (ref 8.9–10.3)
Chloride: 108 mmol/L (ref 98–111)
Creatinine, Ser: 0.83 mg/dL (ref 0.61–1.24)
GFR, Estimated: >60 mL/min
Glucose, Bld: 89 mg/dL (ref 70–99)
Potassium: 4.1 mmol/L (ref 3.5–5.1)
Sodium: 139 mmol/L (ref 135–145)
Total Bilirubin: 0.7 mg/dL (ref 0.0–1.2)
Total Protein: 5.3 g/dL — ABNORMAL LOW (ref 6.5–8.1)

## 2023-05-14 LAB — CBC WITH DIFFERENTIAL/PLATELET
Abs Immature Granulocytes: 0.02 10*3/uL (ref 0.00–0.07)
Basophils Absolute: 0.1 10*3/uL (ref 0.0–0.1)
Basophils Relative: 1 %
Eosinophils Absolute: 0.1 10*3/uL (ref 0.0–0.5)
Eosinophils Relative: 2 %
HCT: 45.5 % (ref 39.0–52.0)
Hemoglobin: 15.3 g/dL (ref 13.0–17.0)
Immature Granulocytes: 0 %
Lymphocytes Relative: 21 %
Lymphs Abs: 1.6 10*3/uL (ref 0.7–4.0)
MCH: 31.2 pg (ref 26.0–34.0)
MCHC: 33.6 g/dL (ref 30.0–36.0)
MCV: 92.7 fL (ref 80.0–100.0)
Monocytes Absolute: 0.6 10*3/uL (ref 0.1–1.0)
Monocytes Relative: 8 %
Neutro Abs: 5.2 10*3/uL (ref 1.7–7.7)
Neutrophils Relative %: 68 %
Platelets: 274 10*3/uL (ref 150–400)
RBC: 4.91 MIL/uL (ref 4.22–5.81)
RDW: 11.9 % (ref 11.5–15.5)
WBC: 7.7 10*3/uL (ref 4.0–10.5)
nRBC: 0 % (ref 0.0–0.2)

## 2023-05-14 LAB — URINALYSIS, ROUTINE W REFLEX MICROSCOPIC
Bilirubin Urine: NEGATIVE
Glucose, UA: NEGATIVE mg/dL
Hgb urine dipstick: NEGATIVE
Ketones, ur: NEGATIVE mg/dL
Leukocytes,Ua: NEGATIVE
Nitrite: NEGATIVE
Protein, ur: NEGATIVE mg/dL
Specific Gravity, Urine: 1.02 (ref 1.005–1.030)
pH: 7 (ref 5.0–8.0)

## 2023-05-14 MED ORDER — CIPROFLOXACIN HCL 500 MG PO TABS: 500.0000 mg | ORAL_TABLET | Freq: Two times a day (BID) | ORAL | 0 refills | Status: AC

## 2023-05-14 MED ORDER — IOHEXOL 300 MG/ML  SOLN
100.0000 mL | Freq: Once | INTRAMUSCULAR | Status: AC | PRN
  Administered 2023-05-14: 100 mL via INTRAVENOUS

## 2023-05-14 NOTE — Discharge Instructions (Signed)
Your CT scan shows your known hernia for which you should follow-up with general surgery as well as some information of your prostate which may be causing your pain.  I recommend you schedule follow-up with your urologist.  I am placing on antibiotics.  If you develop worsening pain, fever, difficulty urinating or having bowel movements or any other new concerning symptoms you should return to the ED.

## 2023-05-14 NOTE — ED Triage Notes (Signed)
Pt c/o rectal pain that started last week.

## 2023-05-14 NOTE — ED Provider Notes (Signed)
[place] [facility] Provider Note   CSN: [id number] Arrival date & time: 05/14/23  1714     History  Chief Complaint  Patient presents with   Rectal Pain    Glenn Kelly is a 54 y.o. male.  HPI 53 year old male presenting for rectal pain.  Been going on for about a week.  Worsened today.  Pain is worse with bowel movements.  It is near his rectum and deep.  He has some pain with urination as well.  No difficulty urinating.  No penile or testicular pain.  He has chronic right inguinal hernia for which he is supposed to get surgery but has not been able to get time off work for.  This is not painful.  No abdominal pain or vomiting.  No rectal bleeding or melena.     Home Medications Prior to Admission medications   Medication Sig Start Date End Date Taking? Authorizing Provider  ciprofloxacin (CIPRO) 500 MG tablet Take 1 tablet (500 mg total) by mouth every 12 (twelve) hours for 14 days. 05/14/23 05/28/23 Yes Laurence Spates, MD  hydrocortisone (ANUSOL-HC) 25 MG suppository Place 1 suppository (25 mg total) rectally 2 (two) times daily. 03/25/22   Lamptey, Britta Mccreedy, MD      Allergies    Patient has no known allergies.    Review of Systems   Review of Systems Review of systems completed and notable as per HPI.  ROS otherwise negative.   Physical Exam Updated Vital Signs BP 133/80 (BP Location: Left Arm)   Pulse 94   Temp 97.7 F (36.5 C) (Oral)   Resp 18   Ht 5\' 5"  (1.651 m)   Wt 77.1 kg   SpO2 100%   BMI 28.29 kg/m  Physical Exam Vitals and nursing note reviewed. Exam conducted with a chaperone present.  Constitutional:      General: He is not in acute distress.    Appearance: He is well-developed.  HENT:     Head: Normocephalic and atraumatic.  Eyes:     Conjunctiva/sclera: Conjunctivae normal.  Cardiovascular:     Rate and Rhythm: Normal rate and regular rhythm.     Pulses: Normal pulses.     Heart sounds: Normal heart  sounds. No murmur heard. Pulmonary:     Effort: Pulmonary effort is normal. No respiratory distress.     Breath sounds: Normal breath sounds.  Abdominal:     Palpations: Abdomen is soft.     Tenderness: There is no abdominal tenderness. There is no right CVA tenderness, left CVA tenderness, guarding or rebound.  Genitourinary:    Comments: No hemorrhoids.  No bleeding.  Rectal exam with some generalized tenderness near the prostate. Musculoskeletal:        General: No swelling.     Cervical back: Neck supple.  Skin:    General: Skin is warm and dry.     Capillary Refill: Capillary refill takes less than 2 seconds.  Neurological:     Mental Status: He is alert.  Psychiatric:        Mood and Affect: Mood normal.     ED Results / Procedures / Treatments   Labs (all labs ordered are listed, but only abnormal results are displayed) Labs Reviewed  COMPREHENSIVE METABOLIC PANEL - Abnormal; Notable for the following components:      Result Value   Calcium 7.9 (*)    Total Protein 5.3 (*)    Albumin 2.9 (*)  All other components within normal limits  CBC WITH DIFFERENTIAL/PLATELET  URINALYSIS, ROUTINE W REFLEX MICROSCOPIC    EKG None  Radiology CT PELVIS W CONTRAST Result Date: 05/14/2023 CLINICAL DATA:  Rectal pain EXAM: CT PELVIS WITH CONTRAST TECHNIQUE: Multidetector CT imaging of the pelvis was performed using the standard protocol following the bolus administration of intravenous contrast. RADIATION DOSE REDUCTION: This exam was performed according to the departmental dose-optimization program which includes automated exposure control, adjustment of the mA and/or kV according to patient size and/or use of iterative reconstruction technique. CONTRAST:  OMNIPAQUE IOHEXOL 300 MG/ML  SOLN COMPARISON:  None Available. FINDINGS: Urinary Tract: Large portion right-side urinary bladder is within large right inguinal hernia, caudal extent incompletely imaged. Bowel: No acute bowel  wall thickening. No perirectal or perianal rim enhancing fluid collections to suggest abscess. Vascular/Lymphatic: No pathologically enlarged lymph nodes. No significant vascular abnormality seen. Reproductive: Markedly enlarged prostate. There may be some haziness and stranding about the prostate and seminal vesicles. Other: Negative for pelvic effusion or free air. Small fat containing left inguinal hernia. Large right inguinal hernia containing fat and urinary bladder. Some thickening along the right spermatic cord potentially due to inflammation. Musculoskeletal: No acute osseous abnormality IMPRESSION: 1. Negative for perirectal or perianal abscess. 2. Markedly enlarged prostate. There may be some haziness and stranding about the prostate and seminal vesicles, correlate for prostatitis. 3. Large right inguinal hernia containing fat and urinary bladder, caudal extent incompletely imaged. Some thickening and stranding about the right spermatic cord which could be inflammatory. Small fat containing left inguinal hernia. Electronically Signed   By: Jasmine Pang M.D.   On: 05/14/2023 19:58    Procedures Procedures    Medications Ordered in ED Medications  iohexol (OMNIPAQUE) 300 MG/ML solution 100 mL (100 mLs Intravenous Contrast Given 05/14/23 1949)    ED Course/ Medical Decision Making/ A&P                                 Medical Decision Making Amount and/or Complexity of Data Reviewed Labs: ordered. Radiology: ordered.  Risk Prescription drug management.   Medical Decision Making:   Glenn Kelly is a 53 y.o. male who presented to the ED today with rectal pain.  Vital signs reviewed.  On exam he is well-appearing.  Rectal exam does not show any hemorrhoids or external abnormalities.  His lab appears quite reassuring.  Slight low albumin and calcium.  CBC without cytosis.  CT pelvis was obtained to rule out perirectal abscess.  There is no evidence of perirectal abscess.  He does have  some stranding around his prostate as well as enlarged prostate.  He is slightly tender over this however if he may have prostatitis.  Urinalysis is overall unremarkable.  He is not sexually active and has not been for some time a lower suspicion for STI.  He has chronic right-sided inguinal hernia which is reducible, no signs of obstruction.  I recommend he follow-up with general surgeon about this.  They note some possible inflammation around the right spermatic cord.  He has no penile discharge or testicular pain and no tenderness over this area so I have lower suspicion for this being an acute process.  I am overall low suspicion for possible prostatitis.  Will treat him with course of Cipro and have him follow-up closely with his urologist and set up with a PCP.  He is comfortable with this  plan.  Strict return precautions given.   Patient placed on continuous vitals and telemetry monitoring while in ED which was reviewed periodically.  Reviewed and confirmed nursing documentation for past medical history, family history, social history.   Patient's presentation is most consistent with acute complicated illness / injury requiring diagnostic workup.           Final Clinical Impression(s) / ED Diagnoses Final diagnoses:  Acute prostatitis    Rx / DC Orders ED Discharge Orders          Ordered    ciprofloxacin (CIPRO) 500 MG tablet  Every 12 hours        05/14/23 2037              Laurence Spates, MD 05/14/23 2041

## 2023-10-10 ENCOUNTER — Telehealth: Payer: Self-pay | Admitting: Physician Assistant

## 2023-10-10 ENCOUNTER — Encounter: Payer: PRIVATE HEALTH INSURANCE | Admitting: Family Medicine

## 2023-10-10 NOTE — Telephone Encounter (Signed)
 Routing to the correct office. Patient last saw Dr. Aloysius.

## 2023-10-10 NOTE — Telephone Encounter (Signed)
 Copied from CRM [id number]. Topic: Appointments - Transfer of Care >> Oct 10, 2023  9:05 AM Donna BRAVO wrote:  Pt is requesting to transfer FROM: Glenn Kelly [facility] Pt is requesting to transfer TO: [facility] Reason for requested transfer: N/A It is the responsibility of the team the patient would like to transfer to (Dr. ) to reach out to the patient if for any reason this transfer is not acceptable.   Patient was originally scheduled for 10/10/2023 made on 07/23/23 and he called today cancel the appt, and he will call back at a later time to reschedule the appt. I am sending this CRM because it was not originally created at the time of the original scheduling of TOC appt. If TOC is the incorrect appt type I cannot make those changes since the Decision tree will flag him as a TOC,  it considers him an established patient of the [facility]

## 2023-10-10 NOTE — Telephone Encounter (Signed)
 Apologies for the confusion. I was originally trying to route this message to the patient's PCP, but for some reason the provider was not pulling up, So I routed it to you since the patient last saw you.

## 2023-12-16 ENCOUNTER — Ambulatory Visit (INDEPENDENT_AMBULATORY_CARE_PROVIDER_SITE_OTHER): Payer: PRIVATE HEALTH INSURANCE | Admitting: Family Medicine

## 2023-12-16 ENCOUNTER — Encounter: Payer: Self-pay | Admitting: Family Medicine

## 2023-12-16 VITALS — BP 108/71 | HR 64 | Ht 66.0 in | Wt 152.8 lb

## 2023-12-16 DIAGNOSIS — Z125 Encounter for screening for malignant neoplasm of prostate: Secondary | ICD-10-CM

## 2023-12-16 DIAGNOSIS — K649 Unspecified hemorrhoids: Secondary | ICD-10-CM | POA: Diagnosis not present

## 2023-12-16 DIAGNOSIS — K4090 Unilateral inguinal hernia, without obstruction or gangrene, not specified as recurrent: Secondary | ICD-10-CM | POA: Insufficient documentation

## 2023-12-16 DIAGNOSIS — B356 Tinea cruris: Secondary | ICD-10-CM | POA: Diagnosis not present

## 2023-12-16 DIAGNOSIS — Z1211 Encounter for screening for malignant neoplasm of colon: Secondary | ICD-10-CM

## 2023-12-16 DIAGNOSIS — E782 Mixed hyperlipidemia: Secondary | ICD-10-CM

## 2023-12-16 DIAGNOSIS — N401 Enlarged prostate with lower urinary tract symptoms: Secondary | ICD-10-CM | POA: Insufficient documentation

## 2023-12-16 DIAGNOSIS — R350 Frequency of micturition: Secondary | ICD-10-CM | POA: Insufficient documentation

## 2023-12-16 DIAGNOSIS — Z1212 Encounter for screening for malignant neoplasm of rectum: Secondary | ICD-10-CM

## 2023-12-16 DIAGNOSIS — Z7689 Persons encountering health services in other specified circumstances: Secondary | ICD-10-CM

## 2023-12-16 MED ORDER — TAMSULOSIN HCL 0.4 MG PO CAPS: 0.4000 mg | ORAL_CAPSULE | Freq: Every day | ORAL | 3 refills | Status: DC

## 2023-12-16 MED ORDER — MICONAZOLE NITRATE 2 % EX CREA: TOPICAL_CREAM | CUTANEOUS | 1 refills | Status: AC

## 2023-12-16 NOTE — Assessment & Plan Note (Signed)
-   Symptoms include waking 3-4 times per night to urinate, some daytime frequency, and occasional incomplete bladder emptying. Denies other obstructive or irritative voiding symptoms. PSA from 2022 was normal. No family history of prostate cancer. - getting PSA.  - prescribing flomax .

## 2023-12-16 NOTE — Assessment & Plan Note (Signed)
-   Reports prior issues with hemorrhoids and constipation, particularly after opioid use a year ago. Bowel movements are now regular and non-painful. No rectal bleeding reported. - no external hemorrhoids noted on exam - needs routine screening colonoscopy for CRC.  Will send in referral.

## 2023-12-16 NOTE — Patient Instructions (Addendum)
 It was nice to see you today,  We addressed the following topics today: -I am ordering some routine lab work.  I will you know the results when I get them - I am sending in a referral for colonoscopy.  Someone will call you to schedule it. - I have prescribed a antifungal cream for use twice a day for the next 2 weeks at least on your groin. - For earwax you can use over-the-counter Debrox solution daily.  This is available at most pharmacies and [address].  Have a great day,  Rolan Slain, MD

## 2023-12-16 NOTE — Assessment & Plan Note (Addendum)
-   Reports intermittent pruritus and flaking on the scrotum for approximately one year. Suspicious for tinea cruris. - no significant rash on GU exam.   - sending in miconazole  cream - BID

## 2023-12-16 NOTE — Progress Notes (Unsigned)
 New Patient Office Visit  Subjective   Patient ID: AUTHOR HATLESTAD, male    DOB: 1970/04/11  Age: 53 y.o. MRN: [id number]  CC:  Chief Complaint  Patient presents with   New Patient (Initial Visit)    HPI Glenn Kelly presents to establish care  Subjective - Establishing primary care. New patient to this practice. Previously seen at a clinic in [place] but has not had a regular primary care provider. Fiance is a patient at this practice. - Concern for hemorrhoids and constipation. History of constipation following a prescription for oxycodone for a work-related leg injury about a year ago. Constipation has improved over the last 6 months. Currently has daily bowel movements without pain or blood. - Urinary symptoms. Reports nocturia, waking 3-4 times per night to urinate. Also has some frequency during the day. Reports occasional sensation of incomplete bladder emptying. Denies urinary hesitancy, changes in stream strength, or dysuria. Last PSA was in 2022. - Genital itching. Reports intermittent itching and flaking in the scrotal area for about a year. Notes scratching produces small, dark flakes. Has not used any topical treatments. - Testicular swelling. Reports concern that testicles are real swollen at times, though not currently. Reports no pain.  Medications Reports a history of taking oxycodone approximately one year ago for a work injury. Not currently taking any prescribed medications.  PMH, PSH, FH, Social Hx PMHx: History of hemorrhoids. No chronic medical issues such as hypertension or diabetes. PSHx: Open heart surgery at age 61 at [facility]. Patient is unsure of the indication; initially thought it was for a hole in the heart but mother denied this. Had follow-up with cardiology as a child and was eventually told no further follow-up was needed. FHx: No family history of cancer, diabetes, or heart disease. No family history of prostate cancer. Social Hx: Denies  tobacco, alcohol, or recreational drug use. Has a fiance. Has two children, ages 66 and 81. 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  ROS Genitourinary: Positive for nocturia, urinary frequency, and occasional incomplete bladder emptying. Negative for dysuria, hesitancy, weak stream. Gastrointestinal: Reports history of constipation, now resolved. Negative for hematochezia or painful defecation. Integumentary: Positive for pruritus and flaking in the scrotal region. Constitutional: Negative for fevers, chills, weight loss.     Outpatient Encounter Medications as of 12/16/2023  Medication Sig   miconazole  (MICOTIN) 2 % cream Apply to groin twice a day for two weeks for itching   tamsulosin  (FLOMAX ) 0.4 MG CAPS capsule Take 1 capsule (0.4 mg total) by mouth daily.   hydrocortisone  (ANUSOL -HC) 25 MG suppository Place 1 suppository (25 mg total) rectally 2 (two) times daily. (Patient not taking: Reported on 12/16/2023)   No facility-administered encounter medications on file as of 12/16/2023.    History reviewed. No pertinent past medical history.  Past Surgical History:  Procedure Laterality Date   congenital heart      EYE SURGERY      History reviewed. No pertinent family history.  Social History   Socioeconomic History   Marital status: Single    Spouse name: Not on file   Number of children: Not on file   Years of education: Not on file   Highest education level: Not on file  Occupational History   Not on file  Tobacco Use   Smoking status: Never   Smokeless tobacco: Never  Vaping Use   Vaping status: Never Used  Substance and Sexual Activity   Alcohol use: No  Drug use: No   Sexual activity: Yes  Other Topics Concern   Not on file  Social History Narrative   Not on file   Social Drivers of Health   Financial Resource Strain: Low Risk  (12/16/2023)   Overall Financial Resource Strain (CARDIA)    Difficulty of Paying Living Expenses: Not hard at all  Food  Insecurity: No Food Insecurity (12/16/2023)   Hunger Vital Sign    Worried About Running Out of Food in the Last Year: Never true    Ran Out of Food in the Last Year: Never true  Transportation Needs: Not on file  Physical Activity: Not on file  Stress: No Stress Concern Present (12/16/2023)   [employer] of Occupational Health - Occupational Stress Questionnaire    Feeling of Stress: Not at all  Social Connections: Not on file  Intimate Partner Violence: Not on file    ROS     Objective   BP 108/71   Pulse 64   Ht 5' 6 (1.676 m)   Wt 152 lb 12.8 oz (69.3 kg)   SpO2 98%   BMI 24.66 kg/m   Physical Exam Gen: alert, oriented HEENT: perrla, eomi, mmm. esotropia CV: rrr, no murmur Pulm: lctab. No wheeze or crackles.  GI: soft, nbs.  Nontender to palpation.  No hemorrhoids observed GU: scrotal swelling.  Nontender to palpation MSK: strength equal b/l. Normal gait Ext: no pedal edema Skin: warm and dry, no inguinal or scrotal rash noted.  Psych: pleasant affect.  Spontaneous speech     Assessment & Plan:   Encounter to establish care  Right inguinal hernia Assessment & Plan: Seen on imaging previously.  Has seen gen surgery in 2022 but has not gone through with surgery yet.  Has scrotal swelling 2/2 this.     Urinary frequency Assessment & Plan: - Symptoms include waking 3-4 times per night to urinate, some daytime frequency, and occasional incomplete bladder emptying. Denies other obstructive or irritative voiding symptoms. PSA from 2022 was normal. No family history of prostate cancer. - getting PSA.  - prescribing flomax .    Tinea cruris Assessment & Plan: - Reports intermittent pruritus and flaking on the scrotum for approximately one year. Suspicious for tinea cruris. - no significant rash on GU exam.   - sending in miconazole  cream - BID   Hemorrhoids, unspecified hemorrhoid type Assessment & Plan: - Reports prior issues with hemorrhoids and  constipation, particularly after opioid use a year ago. Bowel movements are now regular and non-painful. No rectal bleeding reported. - no external hemorrhoids noted on exam - needs routine screening colonoscopy for CRC.  Will send in referral.    Encounter for colorectal cancer screening -     Ambulatory referral to Gastroenterology  Prostate cancer screening -     PSA  Hypocalcemia -     Comprehensive metabolic panel with GFR -     CBC with Differential/Platelet -     VITAMIN D  25 Hydroxy (Vit-D Deficiency, Fractures)  Moderate mixed hyperlipidemia not requiring statin therapy -     Comprehensive metabolic panel with GFR -     CBC with Differential/Platelet -     Lipid panel -     TSH -     Hemoglobin A1c  Other orders -     Tamsulosin  HCl; Take 1 capsule (0.4 mg total) by mouth daily.  Dispense: 30 capsule; Refill: 3 -     Miconazole  Nitrate; Apply to groin twice a day for  two weeks for itching  Dispense: 28.35 g; Refill: 1    Return in about 3 months (around 03/16/2024) for hernia, hld.   Toribio MARLA Slain, MD

## 2023-12-16 NOTE — Assessment & Plan Note (Signed)
 Seen on imaging previously.  Has seen gen surgery in 2022 but has not gone through with surgery yet.  Has scrotal swelling 2/2 this.

## 2023-12-17 ENCOUNTER — Ambulatory Visit: Payer: Self-pay | Admitting: Family Medicine

## 2023-12-17 ENCOUNTER — Other Ambulatory Visit: Payer: Self-pay | Admitting: Family Medicine

## 2023-12-17 DIAGNOSIS — R972 Elevated prostate specific antigen [PSA]: Secondary | ICD-10-CM

## 2023-12-17 LAB — LIPID PANEL
Chol/HDL Ratio: 3.1 ratio (ref 0.0–5.0)
Cholesterol, Total: 163 mg/dL (ref 100–199)
HDL: 53 mg/dL (ref >39)
LDL Chol Calc (NIH): 98 mg/dL (ref 0–99)
Triglycerides: 60 mg/dL (ref 0–149)
VLDL Cholesterol Cal: 12 mg/dL (ref 5–40)

## 2023-12-17 LAB — COMPREHENSIVE METABOLIC PANEL WITH GFR
ALT: 12 IU/L (ref 0–44)
AST: 19 IU/L (ref 0–40)
Albumin: 3.9 g/dL (ref 3.8–4.9)
Alkaline Phosphatase: 61 IU/L (ref 47–123)
BUN/Creatinine Ratio: 16 (ref 9–20)
BUN: 14 mg/dL (ref 6–24)
Bilirubin Total: 0.6 mg/dL (ref 0.0–1.2)
CO2: 23 mmol/L (ref 20–29)
Calcium: 9.2 mg/dL (ref 8.7–10.2)
Chloride: 106 mmol/L (ref 96–106)
Creatinine, Ser: 0.87 mg/dL (ref 0.76–1.27)
Globulin, Total: 2.3 g/dL (ref 1.5–4.5)
Glucose: 89 mg/dL (ref 70–99)
Potassium: 4.2 mmol/L (ref 3.5–5.2)
Sodium: 140 mmol/L (ref 134–144)
Total Protein: 6.2 g/dL (ref 6.0–8.5)
eGFR: 103 mL/min/1.73 (ref >59)

## 2023-12-17 LAB — HEMOGLOBIN A1C
Est. average glucose Bld gHb Est-mCnc: 85 mg/dL
Hgb A1c MFr Bld: 4.6 % — ABNORMAL LOW (ref 4.8–5.6)

## 2023-12-17 LAB — CBC WITH DIFFERENTIAL/PLATELET
Basophils Absolute: 0.1 x10E3/uL (ref 0.0–0.2)
Basos: 1 %
EOS (ABSOLUTE): 0.1 x10E3/uL (ref 0.0–0.4)
Eos: 1 %
Hematocrit: 45 % (ref 37.5–51.0)
Hemoglobin: 14.8 g/dL (ref 13.0–17.7)
Immature Grans (Abs): 0 x10E3/uL (ref 0.0–0.1)
Immature Granulocytes: 0 %
Lymphocytes Absolute: 1.3 x10E3/uL (ref 0.7–3.1)
Lymphs: 26 %
MCH: 31.5 pg (ref 26.6–33.0)
MCHC: 32.9 g/dL (ref 31.5–35.7)
MCV: 96 fL (ref 79–97)
Monocytes Absolute: 0.4 x10E3/uL (ref 0.1–0.9)
Monocytes: 8 %
Neutrophils Absolute: 3.1 x10E3/uL (ref 1.4–7.0)
Neutrophils: 64 %
Platelets: 303 x10E3/uL (ref 150–450)
RBC: 4.7 x10E6/uL (ref 4.14–5.80)
RDW: 11.4 % — ABNORMAL LOW (ref 11.6–15.4)
WBC: 5 x10E3/uL (ref 3.4–10.8)

## 2023-12-17 LAB — PSA: Prostate Specific Ag, Serum: 5.4 ng/mL — ABNORMAL HIGH (ref 0.0–4.0)

## 2023-12-17 LAB — VITAMIN D 25 HYDROXY (VIT D DEFICIENCY, FRACTURES): Vit D, 25-Hydroxy: 15.9 ng/mL — ABNORMAL LOW (ref 30.0–100.0)

## 2023-12-17 LAB — TSH: TSH: 1.06 u[IU]/mL (ref 0.450–4.500)

## 2023-12-17 MED ORDER — VITAMIN D (ERGOCALCIFEROL) 1.25 MG (50000 UNIT) PO CAPS: 50000.0000 [IU] | ORAL_CAPSULE | ORAL | 0 refills | Status: AC

## 2023-12-23 ENCOUNTER — Encounter: Payer: Self-pay | Admitting: Gastroenterology

## 2024-01-16 ENCOUNTER — Telehealth: Payer: Self-pay | Admitting: Gastroenterology

## 2024-01-16 ENCOUNTER — Ambulatory Visit: Payer: PRIVATE HEALTH INSURANCE

## 2024-01-16 VITALS — Ht 66.0 in | Wt 160.0 lb

## 2024-01-16 DIAGNOSIS — Z1211 Encounter for screening for malignant neoplasm of colon: Secondary | ICD-10-CM

## 2024-01-16 MED ORDER — PEG 3350-KCL-NA BICARB-NACL 420 G PO SOLR: 4000.0000 mL | Freq: Once | ORAL | 0 refills | Status: AC

## 2024-01-16 NOTE — Telephone Encounter (Signed)
 Inbound call from patient stating that he was half asleep during his pre visit with nurse this morning and would like to speak to the nurse about question he has. Patient is requesting a call back today is all possible due to his procedure being scheduled for November the 7 th. Please advise.

## 2024-01-16 NOTE — Telephone Encounter (Signed)
 Reviewed instructions with pt as well as what he need to pick up from pharmacy.  Explained the reasons why having colonoscopy r/o any issues with colon polyps cancers. Explained if something is found and they remove it they will sent to path to determine if its CA. Reviewed with p how to find instructions fo My chart and a hard copy of the instructions to be sent the verified address with pt. No other questions at this time.

## 2024-01-16 NOTE — Progress Notes (Signed)
 Pre visit completed via video call; Patient verified name, DOB, and address; No egg or soy allergy known to patient;  No issues known to pt with past sedation with any surgeries or procedures; Patient denies ever being told they had issues or difficulty with intubation;  No FH of Malignant Hyperthermia; Pt is not on diet pills; Pt is not on home 02;  Pt is not on blood thinners;  Pt denies issues with constipation;  No A fib or A flutter; Have any cardiac testing pending--NO Insurance verified during PV appt--- Medicaid [employer]  Pt can ambulate without assistance;  Pt denies use of chewing tobacco Discussed diabetic/weight loss medication holds; Discussed NSAID holds; Checked BMI to be less than 50; Pt instructed to use [employer] or GoodRx for a price reduction on prep;  Patient's chart reviewed by Norleen Schillings CNRA prior to previsit and patient appropriate for the [facility];  Pre visit completed and red dot placed by patient's name on their procedure day (on provider's schedule);   Instructions sent to MyChart per patient request; also printed and placed with 2nd floor receptionist for patient to pick up per his request;

## 2024-01-20 ENCOUNTER — Telehealth: Payer: Self-pay

## 2024-01-20 NOTE — Telephone Encounter (Signed)
 RN attempted to return call to patient but received patient vm. RN left message asking patient to call if he still has questions.

## 2024-01-20 NOTE — Telephone Encounter (Signed)
 Patient requesting f/u call in regards to previsit questions. Please advise.

## 2024-01-20 NOTE — Telephone Encounter (Signed)
 Wanted to ask if OK to have Root canal on the Friday and have colonoscopy on the 7th. Pt indromed that there should not be any issues or need to cancel and reschedule procdure.Instructed pt on how to find instructions in My Chart. No other questions at this time.

## 2024-01-25 ENCOUNTER — Telehealth: Payer: Self-pay | Admitting: Gastroenterology

## 2024-01-25 NOTE — Telephone Encounter (Signed)
 Patient called with multiple questions about prep including but not limited to what he can eat and how to prepare for his procedure later this week.  All questions were answered and he was recommended to refer to his letter on his MyChart.

## 2024-01-29 ENCOUNTER — Encounter: Payer: Self-pay | Admitting: Gastroenterology

## 2024-01-29 NOTE — Progress Notes (Signed)
 Patient called this morning. Patient just got off work. He asked whether he could have something to eat. I told him it was OK to have a normal meal as per his normal schedule. Starting at 6 AM moving forward he should be on clear liquids the rest of the day. He also was a little confused with his instructions for his bio preparation for colonoscopy on Friday. We went over that this morning. No further questions. GM

## 2024-01-30 ENCOUNTER — Telehealth: Payer: Self-pay | Admitting: *Deleted

## 2024-01-30 ENCOUNTER — Encounter: Payer: Self-pay | Admitting: Gastroenterology

## 2024-01-30 ENCOUNTER — Ambulatory Visit: Payer: PRIVATE HEALTH INSURANCE | Admitting: Gastroenterology

## 2024-01-30 VITALS — BP 104/66 | HR 69 | Temp 98.2°F | Resp 16 | Ht 66.0 in | Wt 160.0 lb

## 2024-01-30 DIAGNOSIS — Z1211 Encounter for screening for malignant neoplasm of colon: Secondary | ICD-10-CM | POA: Diagnosis not present

## 2024-01-30 DIAGNOSIS — D122 Benign neoplasm of ascending colon: Secondary | ICD-10-CM | POA: Diagnosis not present

## 2024-01-30 DIAGNOSIS — K648 Other hemorrhoids: Secondary | ICD-10-CM

## 2024-01-30 DIAGNOSIS — K5730 Diverticulosis of large intestine without perforation or abscess without bleeding: Secondary | ICD-10-CM

## 2024-01-30 MED ORDER — SODIUM CHLORIDE 0.9 % IV SOLN: 500.0000 mL | Freq: Once | INTRAVENOUS | Status: DC

## 2024-01-30 NOTE — Progress Notes (Signed)
 [facility] History and Physical   Primary Care Physician:  Chandra Toribio POUR, MD   Reason for Procedure:   Colon cancer screening  Plan:    colonoscopy     HPI: Glenn Kelly is a 53 y.o. male  here for colonoscopy screening - first time exam.   Patient denies any bowel symptoms at this time. No family history of colon cancer known. Otherwise feels well without any cardiopulmonary symptoms.   I have discussed risks / benefits of anesthesia and endoscopic procedure with Saba D Spillane and they wish to proceed with the exams as outlined today.   The patient was provided an opportunity to ask questions and all were answered. The patient agreed with the plan.    Past Medical History:  Diagnosis Date   Right inguinal hernia    no surgery as of 01/16/2024    Past Surgical History:  Procedure Laterality Date   congenital heart      EYE SURGERY      Prior to Admission medications   Medication Sig Start Date End Date Taking? Authorizing Provider  miconazole  (MICOTIN) 2 % cream Apply to groin twice a day for two weeks for itching Patient taking differently: Apply 1 Application topically 2 (two) times daily. Apply to groin twice a day for two weeks for itching 12/16/23  Yes Chandra Toribio POUR, MD  tamsulosin  (FLOMAX ) 0.4 MG CAPS capsule Take 1 capsule (0.4 mg total) by mouth daily. 12/16/23  Yes Chandra Toribio POUR, MD  Vitamin D , Ergocalciferol , (DRISDOL ) 1.25 MG (50000 UNIT) CAPS capsule Take 1 capsule (50,000 Units total) by mouth every 7 (seven) days. 12/17/23  Yes Chandra Toribio POUR, MD  hydrocortisone  (ANUSOL -HC) 25 MG suppository Place 1 suppository (25 mg total) rectally 2 (two) times daily. Patient not taking: Reported on 01/16/2024 03/25/22   Blaise Aleene KIDD, MD    Current Outpatient Medications  Medication Sig Dispense Refill   miconazole  (MICOTIN) 2 % cream Apply to groin twice a day for two weeks for itching (Patient taking differently: Apply 1 Application topically  2 (two) times daily. Apply to groin twice a day for two weeks for itching) 28.35 g 1   tamsulosin  (FLOMAX ) 0.4 MG CAPS capsule Take 1 capsule (0.4 mg total) by mouth daily. 30 capsule 3   Vitamin D , Ergocalciferol , (DRISDOL ) 1.25 MG (50000 UNIT) CAPS capsule Take 1 capsule (50,000 Units total) by mouth every 7 (seven) days. 12 capsule 0   hydrocortisone  (ANUSOL -HC) 25 MG suppository Place 1 suppository (25 mg total) rectally 2 (two) times daily. (Patient not taking: Reported on 01/16/2024) 12 suppository 0   Current Facility-Administered Medications  Medication Dose Route Frequency Provider Last Rate Last Admin   0.9 %  sodium chloride infusion  500 mL Intravenous Once Danzig Macgregor, Elspeth SQUIBB, MD        Allergies as of 01/30/2024   (No Known Allergies)    Family History  Problem Relation Age of Onset   Colon polyps Neg Hx    Colon cancer Neg Hx    Esophageal cancer Neg Hx    Rectal cancer Neg Hx    Stomach cancer Neg Hx     Social History   Socioeconomic History   Marital status: Single    Spouse name: Not on file   Number of children: Not on file   Years of education: Not on file   Highest education level: Not on file  Occupational History   Not on file  Tobacco Use  Smoking status: Never   Smokeless tobacco: Never  Vaping Use   Vaping status: Never Used  Substance and Sexual Activity   Alcohol use: No   Drug use: No   Sexual activity: Yes  Other Topics Concern   Not on file  Social History Narrative   Not on file   Social Drivers of Health   Financial Resource Strain: Low Risk  (12/16/2023)   Overall Financial Resource Strain (CARDIA)    Difficulty of Paying Living Expenses: Not hard at all  Food Insecurity: No Food Insecurity (12/16/2023)   Hunger Vital Sign    Worried About Running Out of Food in the Last Year: Never true    Ran Out of Food in the Last Year: Never true  Transportation Needs: Not on file  Physical Activity: Not on file  Stress: No Stress  Concern Present (12/16/2023)   [employer] of Occupational [employer] - Occupational Stress Questionnaire    Feeling of Stress: Not at all  Social Connections: Not on file  Intimate Partner Violence: Not on file    Review of Systems: All other review of systems negative except as mentioned in the HPI.  Physical Exam: Vital signs BP 112/71   Pulse 89   Temp 98.2 F (36.8 C) (Skin)   Ht 5' 6 (1.676 m)   Wt 160 lb (72.6 kg)   SpO2 96%   BMI 25.82 kg/m   General:   Alert,  Well-developed, pleasant and cooperative in NAD Lungs:  Clear throughout to auscultation.   Heart:  Regular rate and rhythm Abdomen:  Soft, nontender and nondistended.   Neuro/Psych:  Alert and cooperative. Normal mood and affect. A and O x 3  Marcey Naval, MD [facility]

## 2024-01-30 NOTE — Telephone Encounter (Signed)
 Patient needing assistance with prep instructions.

## 2024-01-30 NOTE — Progress Notes (Signed)
To pacu, VSS. Report to [email or web address]

## 2024-01-30 NOTE — Telephone Encounter (Signed)
 Returned the patient's phone call. He has questions about eating one bite of a banana this am and put me on hold. Tried calling the patient back and it went to VM. Left message for him to return my call.

## 2024-01-30 NOTE — Op Note (Signed)
 [facility] Patient Name: Glenn Kelly Procedure Date: 01/30/2024 2:50 PM MRN: [id number] Endoscopist: Elspeth P. Leigh , MD, [id number] Age: 53 Referring MD:  Date of Birth: 1971-02-14 Gender: Male Account #: [id number] Procedure:                Colonoscopy Indications:              Screening for colorectal malignant neoplasm, This                            is the patient's first colonoscopy Medicines:                Monitored Anesthesia Care Procedure:                Pre-Anesthesia Assessment:                           - Prior to the procedure, a History and Physical                            was performed, and patient medications and                            allergies were reviewed. The patient's tolerance of                            previous anesthesia was also reviewed. The risks                            and benefits of the procedure and the sedation                            options and risks were discussed with the patient.                            All questions were answered, and informed consent                            was obtained. Prior Anticoagulants: The patient has                            taken no anticoagulant or antiplatelet agents. ASA                            Grade Assessment: II - A patient with mild systemic                            disease. After reviewing the risks and benefits,                            the patient was deemed in satisfactory condition to                            undergo the procedure.  After obtaining informed consent, the colonoscope                            was passed under direct vision. Throughout the                            procedure, the patient's blood pressure, pulse, and                            oxygen saturations were monitored continuously. The                            Olympus Scope SN [id number] was introduced through the                            anus and advanced  to the the cecum, identified by                            appendiceal orifice and ileocecal valve. The                            colonoscopy was performed without difficulty. The                            patient tolerated the procedure well. The quality                            of the bowel preparation was good. The ileocecal                            valve, appendiceal orifice, and rectum were                            photographed. Scope In: 3:03:52 PM Scope Out: 3:21:21 PM Scope Withdrawal Time: 0 hours 12 minutes 59 seconds  Total Procedure Duration: 0 hours 17 minutes 29 seconds  Findings:                 The perianal and digital rectal examinations were                            normal.                           A 4 mm polyp was found in the ascending colon. The                            polyp was sessile. The polyp was removed with a                            cold snare. Resection and retrieval were complete.                           A few small-mouthed diverticula were found in the  left colon.                           Internal hemorrhoids were found during retroflexion.                           The exam was otherwise without abnormality. Complications:            No immediate complications. Estimated blood loss:                            Minimal. Estimated Blood Loss:     Estimated blood loss was minimal. Impression:               - One 4 mm polyp in the ascending colon, removed                            with a cold snare. Resected and retrieved.                           - Diverticulosis in the left colon.                           - Internal hemorrhoids.                           - The examination was otherwise normal. Recommendation:           - Patient has a contact number available for                            emergencies. The signs and symptoms of potential                            delayed complications were discussed with the                             patient. Return to normal activities tomorrow.                            Written discharge instructions were provided to the                            patient.                           - Resume previous diet.                           - Continue present medications.                           - Await pathology results. Elspeth P. Leigh, MD 01/30/2024 3:25:28 PM This report has been signed electronically.

## 2024-01-30 NOTE — Telephone Encounter (Signed)
 RN returned call to patient. He states he completed all of his prep instructions as outlined in his letter, having refrained from the list of high fiber foods for 5 days prior, taking 4 Dulcolax tablets and drinking 1/2 of his prep yesterday until this morning at 8:45 when he ate one small bite of banana before realizing he was not to consume that.  RN questioned patient thoroughly about anything else he may have consumed; he states that this was all.   RN instructed him to finish his prep and finish all oral intake by 11:00 this morning, including no gum, candy, mints, water, or anything at all. RN told him to call us  back if he is not seeing clear/clear yellow in the toilet before he leaves for his appt.   All questions were addressed. Patient stated understanding all information discussed.

## 2024-01-30 NOTE — Progress Notes (Signed)
 Pt's states no medical or surgical changes since previsit or office visit.

## 2024-01-30 NOTE — Patient Instructions (Signed)
Handouts provided about polyps, hemorrhoids and diverticulosis.  Resume previous diet.  Continue present medications. Await pathology results.  YOU HAD AN ENDOSCOPIC PROCEDURE TODAY AT THE [facility]:   Refer to the procedure report that was given to you for any specific questions about what was found during the examination.  If the procedure report does not answer your questions, please call your gastroenterologist to clarify.  If you requested that your care partner not be given the details of your procedure findings, then the procedure report has been included in a sealed envelope for you to review at your convenience later.  YOU SHOULD EXPECT: Some feelings of bloating in the abdomen. Passage of more gas than usual.  Walking can help get rid of the air that was put into your GI tract during the procedure and reduce the bloating. If you had a lower endoscopy (such as a colonoscopy or flexible sigmoidoscopy) you may notice spotting of blood in your stool or on the toilet paper. If you underwent a bowel prep for your procedure, you may not have a normal bowel movement for a few days.  Please Note:  You might notice some irritation and congestion in your nose or some drainage.  This is from the oxygen used during your procedure.  There is no need for concern and it should clear up in a day or so.  SYMPTOMS TO REPORT IMMEDIATELY:  Following lower endoscopy (colonoscopy or flexible sigmoidoscopy):  Excessive amounts of blood in the stool  Significant tenderness or worsening of abdominal pains  Swelling of the abdomen that is new, acute  Fever of 100F or higher   For urgent or emergent issues, a gastroenterologist can be reached at any hour by calling [phone]. Do not use MyChart messaging for urgent concerns.    DIET:  We do recommend a small meal at first, but then you may proceed to your regular diet.  Drink plenty of fluids but you should avoid alcoholic beverages for 24  hours.  ACTIVITY:  You should plan to take it easy for the rest of today and you should NOT DRIVE or use heavy machinery until tomorrow (because of the sedation medicines used during the test).    FOLLOW UP: Our staff will call the number listed on your records the next business day following your procedure.  We will call around 7:15- 8:00 am to check on you and address any questions or concerns that you may have regarding the information given to you following your procedure. If we do not reach you, we will leave a message.     If any biopsies were taken you will be contacted by phone or by letter within the next 1-3 weeks.  Please call us at [phone] if you have not heard about the biopsies in 3 weeks.    SIGNATURES/CONFIDENTIALITY: You and/or your care partner have signed paperwork which will be entered into your electronic medical record.  These signatures attest to the fact that that the information above on your After Visit Summary has been reviewed and is understood.  Full responsibility of the confidentiality of this discharge information lies with you and/or your care-partner.

## 2024-02-02 ENCOUNTER — Telehealth: Payer: Self-pay

## 2024-02-02 NOTE — Telephone Encounter (Signed)
No answer on follow up call. 

## 2024-02-04 NOTE — Telephone Encounter (Signed)
 Made two attempts to call pt back to answer questions per phone note. No answer. Left message for pt to call back.

## 2024-02-04 NOTE — Telephone Encounter (Signed)
 Patient requesting f/u call to discuss questions he has. Please advise.   Thank you

## 2024-02-05 ENCOUNTER — Ambulatory Visit: Payer: Self-pay | Admitting: Gastroenterology

## 2024-02-05 LAB — SURGICAL PATHOLOGY

## 2024-02-06 ENCOUNTER — Telehealth: Payer: Self-pay | Admitting: Gastroenterology

## 2024-02-06 NOTE — Telephone Encounter (Signed)
 Patient requests to discuss pathology results. Results have been explained to patient's satisfaction. He also inquires about the finding of internal hemorrhoids on procedure notes. States he was told about medication to take. He denies any rectal bleeding at this time. He is advised that he may purchase calmol4 suppositories over the counter and use nightly x 7 nights if needed. He is asked to let us  know if he develops new rectal bleeding or additional symptoms.

## 2024-02-06 NOTE — Telephone Encounter (Signed)
 Patient requesting to speak in regards to Hemorid as well as results.  Please advise.

## 2024-02-09 ENCOUNTER — Other Ambulatory Visit: Payer: PRIVATE HEALTH INSURANCE

## 2024-02-09 DIAGNOSIS — R972 Elevated prostate specific antigen [PSA]: Secondary | ICD-10-CM

## 2024-02-11 ENCOUNTER — Telehealth: Payer: Self-pay

## 2024-02-11 LAB — PSA, TOTAL AND FREE
PSA, Free Pct: 20.7 %
PSA, Free: 0.91 ng/mL
Prostate Specific Ag, Serum: 4.4 ng/mL — ABNORMAL HIGH (ref 0.0–4.0)

## 2024-02-11 NOTE — Telephone Encounter (Signed)
 Copied from CRM #[id number]. Topic: Clinical - Lab/Test Results >> Feb 11, 2024  8:15 AM Everette C wrote: Reason for CRM: The patient has called and requested to please be contacted by a member of clinical staff to review their most recent lab results when they're available

## 2024-02-12 ENCOUNTER — Telehealth: Payer: Self-pay

## 2024-02-12 NOTE — Telephone Encounter (Signed)
 Copied from CRM #[id number]. Topic: Clinical - Lab/Test Results >> Feb 11, 2024  8:15 AM Everette C wrote: Reason for CRM: The patient has called and requested to please be contacted by a member of clinical staff to review their most recent lab results when they're available

## 2024-02-13 ENCOUNTER — Ambulatory Visit: Payer: Self-pay | Admitting: Family Medicine

## 2024-02-13 NOTE — Progress Notes (Signed)
 Seen by patient in mychart 02/13/2024

## 2024-02-16 ENCOUNTER — Encounter: Payer: Self-pay | Admitting: Family Medicine

## 2024-02-16 ENCOUNTER — Ambulatory Visit (INDEPENDENT_AMBULATORY_CARE_PROVIDER_SITE_OTHER): Payer: PRIVATE HEALTH INSURANCE | Admitting: Family Medicine

## 2024-02-16 VITALS — BP 107/70 | HR 71 | Ht 66.0 in | Wt 154.0 lb

## 2024-02-16 DIAGNOSIS — K649 Unspecified hemorrhoids: Secondary | ICD-10-CM | POA: Diagnosis not present

## 2024-02-16 DIAGNOSIS — E559 Vitamin D deficiency, unspecified: Secondary | ICD-10-CM

## 2024-02-16 DIAGNOSIS — R351 Nocturia: Secondary | ICD-10-CM

## 2024-02-16 DIAGNOSIS — R972 Elevated prostate specific antigen [PSA]: Secondary | ICD-10-CM | POA: Diagnosis not present

## 2024-02-16 MED ORDER — DOXAZOSIN MESYLATE 4 MG PO TABS: 4.0000 mg | ORAL_TABLET | Freq: Every day | ORAL | 2 refills | Status: AC

## 2024-02-16 MED ORDER — DOCUSATE SODIUM 50 MG/5ML PO LIQD: ORAL | 0 refills | Status: DC

## 2024-02-16 MED ORDER — CHOLECALCIFEROL 100 MCG (4000 UT) PO CAPS: ORAL_CAPSULE | ORAL | 5 refills | Status: AC

## 2024-02-16 NOTE — Patient Instructions (Addendum)
 It was nice to see you today,  We addressed the following topics today:  - I have sent a prescription for Cardura  to your pharmacy. Please start this medication for your nighttime urination. - take this medication instead of your flomax  - After you finish your last weekly dose of vitamin D , please start taking an over-the-counter daily vitamin D  supplement at 4000 units daily. I can send a prescription for this if you prefer, but it is available without one. - You only need to use the hemorrhoid suppositories if you are having symptoms like pain or bleeding. You do not need to use them every day. -  I am sending in a referral to the podiatrist for your foot pain.  You can also use over the counter metatarsal pads like the ones pictured below.   Have a great day,  Rolan Slain, MD

## 2024-02-16 NOTE — Progress Notes (Unsigned)
   Established Patient Office Visit  Subjective   Patient ID: Glenn Kelly, male    DOB: 1970/04/10  Age: 53 y.o. MRN: [id number]  Chief Complaint  Patient presents with   Medical Management of Chronic Issues    HPI  Subjective - Follow-up on recent labs and ongoing issues. - Reports nocturia is unchanged despite treatment with Flomax . States it helped a little bit but not much. Denies other new urinary symptoms. - Follow-up on vitamin D  deficiency. - Inquired about need for hemorrhoid suppositories, reports no current symptoms like bleeding or pain. - Mentioned previous discussion about ear cleaning.  Medications Tamsulosin  (Flomax ) for nocturia, reports minimal efficacy and has run out. Weekly vitamin D , last dose was last Monday, one dose remaining. Hydrocortisone  suppositories for hemorrhoids, prescription not filled.  PMH, PSH, FH, Social Hx PMHx: Elevated PSA, vitamin D  deficiency, internal hemorrhoids. PSH: Colonoscopy was normal, follow-up in 10 years. Rectal exam performed at last visit.  ROS GU: Reports continued nocturia. Denies other urinary symptoms. Blood sugar is not elevated. GI: Denies rectal bleeding or pain from hemorrhoids.  Objective Vitals: Not assessed. HEENT: Cerumen impaction noted in both ears. Partial removal performed in one ear. Irrigation by MA performed. Post-irrigation exam pending.  Assessment and Plan Elevated PSA - History of mildly elevated PSA. Most recent level was 4.4, down from 5.4 two months prior. - Continue to monitor PSA. Recheck in 1 year unless new urinary symptoms develop. Will refer to urology for consideration of MRI/biopsy if PSA rises significantly.  Nocturia/BPH - Persistent nocturia despite a trial of tamsulosin  (Flomax ). - Discontinue Flomax . - Start Cardura . - If symptoms do not improve with Cardura , will refer to urology. May consider trial of an overactive bladder medication.  Vitamin D  Deficiency - Previously  on weekly high-dose vitamin D . - Finish remaining weekly dose. - Start over-the-counter daily vitamin D  supplement.  Hemorrhoids - Has a prescription for hydrocortisone  suppositories. Reports no current symptoms such as bleeding or pain. - Counseled to use suppositories only as needed for symptoms. No need for daily use.  Cerumen Impaction - Bilateral cerumen impaction noted on exam. - Partial removal of cerumen performed. - Referred to MA for ear irrigation. Will re-evaluate after irrigation.   The 10-year ASCVD risk score (Arnett DK, et al., 2019) is: 4.1%  Health Maintenance Due  Topic Date Due   HIV Screening  Never done   Hepatitis C Screening  Never done   DTaP/Tdap/Td (1 - Tdap) Never done   Hepatitis B Vaccines 19-59 Average Risk (1 of 3 - 19+ 3-dose series) Never done   Pneumococcal Vaccine: 50+ Years (1 of 1 - PCV) Never done   Zoster Vaccines- Shingrix (1 of 2) Never done   COVID-19 Vaccine (1 - 2025-26 season) Never done      Objective:     BP 107/70   Pulse 71   Ht 5' 6 (1.676 m)   Wt 154 lb (69.9 kg)   SpO2 98%   BMI 24.86 kg/m  {Vitals History (Optional):23777}  Physical Exam   No results found for any visits on 02/16/24.      Assessment & Plan:   There are no diagnoses linked to this encounter.   No follow-ups on file.    Glenn MARLA Slain, MD

## 2024-02-19 DIAGNOSIS — R351 Nocturia: Secondary | ICD-10-CM | POA: Insufficient documentation

## 2024-02-19 DIAGNOSIS — R972 Elevated prostate specific antigen [PSA]: Secondary | ICD-10-CM | POA: Insufficient documentation

## 2024-02-19 DIAGNOSIS — E559 Vitamin D deficiency, unspecified: Secondary | ICD-10-CM | POA: Insufficient documentation

## 2024-02-19 NOTE — Assessment & Plan Note (Signed)
-   Has a prescription for hydrocortisone  suppositories. Reports no current symptoms such as bleeding or pain. - Counseled to use suppositories only as needed for symptoms. No need for daily use.

## 2024-02-19 NOTE — Assessment & Plan Note (Signed)
-   Previously on weekly high-dose vitamin D . - Finish remaining weekly dose. - Start over-the-counter daily vitamin D  supplement.

## 2024-02-19 NOTE — Assessment & Plan Note (Signed)
 Discussed psa results, likelihood of prostate cancer, slow growing nature of most prostate cancers, and routine monitoring.   - History of mildly elevated PSA. Most recent level was 4.4, down from 5.4 two months prior. - Continue to monitor PSA. Recheck in 1 year unless new urinary symptoms develop.

## 2024-02-19 NOTE — Assessment & Plan Note (Signed)
-   Persistent nocturia despite a trial of tamsulosin  (Flomax ). - Discontinue Flomax . - Start Cardura . - If symptoms do not improve with Cardura , will refer to urology. May consider trial of an overactive bladder medication.

## 2024-03-16 ENCOUNTER — Ambulatory Visit: Payer: PRIVATE HEALTH INSURANCE | Admitting: Family Medicine

## 2024-03-31 ENCOUNTER — Encounter (HOSPITAL_COMMUNITY): Payer: Self-pay

## 2024-03-31 ENCOUNTER — Ambulatory Visit: Payer: Self-pay | Admitting: *Deleted

## 2024-03-31 ENCOUNTER — Other Ambulatory Visit: Payer: Self-pay

## 2024-03-31 ENCOUNTER — Emergency Department (HOSPITAL_COMMUNITY)

## 2024-03-31 ENCOUNTER — Emergency Department (HOSPITAL_COMMUNITY)
Admission: EM | Admit: 2024-03-31 | Discharge: 2024-03-31 | Disposition: A | Discharge status: Home / Self Care | Attending: Emergency Medicine | Admitting: Emergency Medicine

## 2024-03-31 DIAGNOSIS — K6289 Other specified diseases of anus and rectum: Secondary | ICD-10-CM | POA: Diagnosis present

## 2024-03-31 DIAGNOSIS — K644 Residual hemorrhoidal skin tags: Secondary | ICD-10-CM | POA: Diagnosis not present

## 2024-03-31 LAB — COMPREHENSIVE METABOLIC PANEL WITH GFR
ALT: 10 U/L (ref 0–44)
AST: 20 U/L (ref 15–41)
Albumin: 3.9 g/dL (ref 3.5–5.0)
Alkaline Phosphatase: 52 U/L (ref 38–126)
Anion gap: 9 (ref 5–15)
BUN: 14 mg/dL (ref 6–20)
CO2: 25 mmol/L (ref 22–32)
Calcium: 9 mg/dL (ref 8.9–10.3)
Chloride: 107 mmol/L (ref 98–111)
Creatinine, Ser: 0.78 mg/dL (ref 0.61–1.24)
GFR, Estimated: >60 mL/min
Glucose, Bld: 88 mg/dL (ref 70–99)
Potassium: 4.2 mmol/L (ref 3.5–5.1)
Sodium: 141 mmol/L (ref 135–145)
Total Bilirubin: 0.8 mg/dL (ref 0.0–1.2)
Total Protein: 6.2 g/dL — ABNORMAL LOW (ref 6.5–8.1)

## 2024-03-31 LAB — URINALYSIS, W/ REFLEX TO CULTURE (INFECTION SUSPECTED)
Bacteria, UA: NONE SEEN
Bilirubin Urine: NEGATIVE
Glucose, UA: NEGATIVE mg/dL
Hgb urine dipstick: NEGATIVE
Ketones, ur: 5 mg/dL — AB
Leukocytes,Ua: NEGATIVE
Nitrite: NEGATIVE
Protein, ur: NEGATIVE mg/dL
Specific Gravity, Urine: 1.032 — ABNORMAL HIGH (ref 1.005–1.030)
pH: 6 (ref 5.0–8.0)

## 2024-03-31 LAB — CBC WITH DIFFERENTIAL/PLATELET
Abs Immature Granulocytes: 0.02 K/uL (ref 0.00–0.07)
Basophils Absolute: 0.1 K/uL (ref 0.0–0.1)
Basophils Relative: 1 %
Eosinophils Absolute: 0 K/uL (ref 0.0–0.5)
Eosinophils Relative: 1 %
HCT: 45 % (ref 39.0–52.0)
Hemoglobin: 15 g/dL (ref 13.0–17.0)
Immature Granulocytes: 0 %
Lymphocytes Relative: 18 %
Lymphs Abs: 1.1 K/uL (ref 0.7–4.0)
MCH: 31.3 pg (ref 26.0–34.0)
MCHC: 33.3 g/dL (ref 30.0–36.0)
MCV: 93.8 fL (ref 80.0–100.0)
Monocytes Absolute: 0.4 K/uL (ref 0.1–1.0)
Monocytes Relative: 7 %
Neutro Abs: 4.6 K/uL (ref 1.7–7.7)
Neutrophils Relative %: 73 %
Platelets: 259 K/uL (ref 150–400)
RBC: 4.8 MIL/uL (ref 4.22–5.81)
RDW: 11.8 % (ref 11.5–15.5)
WBC: 6.2 K/uL (ref 4.0–10.5)
nRBC: 0 % (ref 0.0–0.2)

## 2024-03-31 LAB — POC OCCULT BLOOD, ED: Fecal Occult Bld: NEGATIVE

## 2024-03-31 MED ORDER — AMOXICILLIN-POT CLAVULANATE 875-125 MG PO TABS: 1.0000 | ORAL_TABLET | Freq: Two times a day (BID) | ORAL | 0 refills | Status: DC

## 2024-03-31 MED ORDER — DICYCLOMINE HCL 20 MG PO TABS: 20.0000 mg | ORAL_TABLET | Freq: Two times a day (BID) | ORAL | 0 refills | Status: AC | PRN

## 2024-03-31 MED ORDER — KETOROLAC TROMETHAMINE 15 MG/ML IJ SOLN
15.0000 mg | Freq: Once | INTRAMUSCULAR | Status: AC
  Administered 2024-03-31: 15 mg via INTRAVENOUS
  Filled 2024-03-31: qty 1

## 2024-03-31 MED ORDER — DICYCLOMINE HCL 10 MG PO CAPS
10.0000 mg | ORAL_CAPSULE | Freq: Once | ORAL | Status: AC
  Administered 2024-03-31: 10 mg via ORAL
  Filled 2024-03-31: qty 1

## 2024-03-31 MED ORDER — ACETAMINOPHEN 500 MG PO TABS
1000.0000 mg | ORAL_TABLET | Freq: Once | ORAL | Status: AC
  Administered 2024-03-31: 1000 mg via ORAL
  Filled 2024-03-31: qty 2

## 2024-03-31 MED ORDER — AMOXICILLIN-POT CLAVULANATE 875-125 MG PO TABS
1.0000 | ORAL_TABLET | Freq: Once | ORAL | Status: AC
  Administered 2024-03-31: 1 via ORAL
  Filled 2024-03-31: qty 1

## 2024-03-31 MED ORDER — IOHEXOL 300 MG/ML  SOLN
100.0000 mL | Freq: Once | INTRAMUSCULAR | Status: AC | PRN
  Administered 2024-03-31: 100 mL via INTRAVENOUS

## 2024-03-31 MED ORDER — MORPHINE SULFATE (PF) 2 MG/ML IV SOLN
2.0000 mg | Freq: Once | INTRAVENOUS | Status: AC
  Administered 2024-03-31: 2 mg via INTRAVENOUS
  Filled 2024-03-31: qty 1

## 2024-03-31 NOTE — ED Provider Notes (Signed)
 " [place] [facility] AT [facility] Provider Note   CSN: [id number] Arrival date & time: 03/31/24  9293     History Chief Complaint  Patient presents with   Rectal Pain    HPI: Glenn Kelly is a 54 y.o. male with history pertinent internal hemorrhoids, elevated PSA who presents complaining of rectal pain. Patient arrived via POV.  History provided by patient.  No interpreter required during this encounter.  Patient presents to the emergency department complaining of rectal pain.  Reports that he had a colonoscopy performed approximately 2 months ago and was told that he had internal hemorrhoids.  Reports that he followed up with his PCP approximately a week later and was told that if his hemorrhoids were bothering him they could prescribe a medicated suppository, however no medications were ever prescribed.  Reports that he is not having any pain at that time, however reports that he has had pain over the past 2 days.  Reports that the pain is worse with movement, better with rest.  Denies any aggravation or alleviation of the pain with defecation.  Reports that he defecated just prior to my exam and it was at baseline.  Denies any dysuria, urgency, frequency, penile discharge, pain, testicular pain.  Denies any low back pain.  Denies any fever, chills, chest pain, shortness of breath, nausea, vomiting, diarrhea.  Patient reports that he did use a OTC suppository yesterday as he thought he might be constipated, however denies at this changed his symptoms at all.  Denies insertion of any objects into the anus outside of the suppository he used yesterday, denies participation in the sexual activities involving use of his anus.  Denies melena, hematochezia.  Patient's recorded medical, surgical, social, medication list and allergies were reviewed in the Snapshot window as part of the initial history.   Prior to Admission medications  Medication Sig Start Date End Date  Taking? Authorizing Provider  amoxicillin -clavulanate (AUGMENTIN ) 875-125 MG tablet Take 1 tablet by mouth every 12 (twelve) hours. 03/31/24  Yes Rogelia Jerilynn RAMAN, MD  dicyclomine  (BENTYL ) 20 MG tablet Take 1 tablet (20 mg total) by mouth 2 (two) times daily as needed for spasms. 03/31/24  Yes Rogelia Jerilynn RAMAN, MD  Cholecalciferol  100 MCG (4000 UT) CAPS Take 1 capsule by mouth daily 02/16/24   Olson, Daniel K, MD  docusate (COLACE) 50 MG/5ML liquid Apply 5 drops to the ears and leave in for 5 minutes daily to help with cerumen impaction 02/16/24   Chandra Toribio POUR, MD  doxazosin  (CARDURA ) 4 MG tablet Take 1 tablet (4 mg total) by mouth daily. 02/16/24   Chandra Toribio POUR, MD  hydrocortisone  (ANUSOL -HC) 25 MG suppository Place 1 suppository (25 mg total) rectally 2 (two) times daily. Patient not taking: Reported on 02/16/2024 03/25/22   Blaise Aleene KIDD, MD  miconazole  (MICOTIN) 2 % cream Apply to groin twice a day for two weeks for itching Patient taking differently: Apply 1 Application topically 2 (two) times daily. Apply to groin twice a day for two weeks for itching 12/16/23   Chandra Toribio POUR, MD  Vitamin D , Ergocalciferol , (DRISDOL ) 1.25 MG (50000 UNIT) CAPS capsule Take 1 capsule (50,000 Units total) by mouth every 7 (seven) days. 12/17/23   Chandra Toribio POUR, MD     Allergies: Patient has no known allergies.   Review of Systems   ROS as per HPI  Physical Exam Updated Vital Signs BP 122/73 (BP Location: Left Arm)   Pulse 65  Temp 98.3 F (36.8 C) (Oral)   Resp 17   Ht 5' 6 (1.676 m)   Wt 70.3 kg   SpO2 95%   BMI 25.02 kg/m  Physical Exam Vitals and nursing note reviewed. Exam conducted with a chaperone present.  Constitutional:      General: He is not in acute distress.    Appearance: He is well-developed.  HENT:     Head: Normocephalic and atraumatic.  Eyes:     Conjunctiva/sclera: Conjunctivae normal.  Cardiovascular:     Rate and Rhythm: Normal rate and regular rhythm.      Heart sounds: No murmur heard. Pulmonary:     Effort: Pulmonary effort is normal. No respiratory distress.     Breath sounds: Normal breath sounds.  Abdominal:     Palpations: Abdomen is soft.     Tenderness: There is no abdominal tenderness.  Genitourinary:    Rectum: Guaiac result negative. External hemorrhoid present. No mass or anal fissure.     Comments: Small hemorrhoid at the 12 o'clock position, no large palpable internal hemorrhoids, no masses, patient does have tenderness to palpation of the anterior rectal wall, there is no fluctuance or palpable masses of the prostate, patient has tenderness to the posterior rectal wall, which is more tender than the anterior rectal wall Musculoskeletal:        General: No swelling.     Cervical back: Neck supple.  Skin:    General: Skin is warm and dry.     Capillary Refill: Capillary refill takes less than 2 seconds.  Neurological:     Mental Status: He is alert.  Psychiatric:        Mood and Affect: Mood normal.     ED Course/ Medical Decision Making/ A&P    Procedures Procedures   Medications Ordered in ED Medications  acetaminophen  (TYLENOL ) tablet 1,000 mg (1,000 mg Oral Given 03/31/24 1210)  morphine  (PF) 2 MG/ML injection 2 mg (2 mg Intravenous Given 03/31/24 1210)  iohexol  (OMNIPAQUE ) 300 MG/ML solution 100 mL (100 mLs Intravenous Contrast Given 03/31/24 1300)  ketorolac  (TORADOL ) 15 MG/ML injection 15 mg (15 mg Intravenous Given 03/31/24 1419)  amoxicillin -clavulanate (AUGMENTIN ) 875-125 MG per tablet 1 tablet (1 tablet Oral Given 03/31/24 1419)  dicyclomine  (BENTYL ) capsule 10 mg (10 mg Oral Given 03/31/24 1419)    Medical Decision Making:   OSSIE BELTRAN is a 54 y.o. male who presents for rectal pain as per above.  Physical exam is pertinent for abdomen soft, nontender, tenderness to palpation of the rectal wall, posterior greater than anterior, there is no bogginess or palpable mass of the prostate.   The differential  includes but is not limited to prostatitis, prostatomegaly, stercoral colitis,.  Independent historian: None  External data reviewed: Labs: reviewed prior labs for baseline  Initial Plan:  Screening labs including CBC and Metabolic panel to evaluate for infectious or metabolic etiology of disease.  Fecal occult to evaluate for bleeding hemorrhoids Urinalysis with reflex culture ordered to evaluate for UTI or relevant urologic/nephrologic pathology.  CT abdomen pelvis to evaluate for structural/infectious intra-abdominal/pelvic pathology.  Objective evaluation as below reviewed   Labs: Ordered, Independent interpretation, and Details: CBC without leukocytosis, anemia, thrombocytopenia. UA without evidence of UTI.  CMP without AKI, electrolyte derangement, emergent LFT abnormality.  Point-of-care fecal occult negative.  Radiology: Ordered, Independent interpretation, Details: Patient reviewed CT of abdomen pelvis, I do appreciate large Lawsing, do not appreciate free air, significant free leg, obstructive bowel gas pattern or other  focal hyperenhancement, and All images reviewed independently.  Agree with radiology report at this time.   CT ABDOMEN PELVIS W CONTRAST Result Date: 03/31/2024 EXAM: CT ABDOMEN AND PELVIS WITH CONTRAST 03/31/2024 01:12:56 PM TECHNIQUE: CT of the abdomen and pelvis was performed with the administration of 100 mL of iohexol  (OMNIPAQUE ) 300 MG/ML solution. Multiplanar reformatted images are provided for review. Automated exposure control, iterative reconstruction, and/or weight-based adjustment of the mA/kV was utilized to reduce the radiation dose to as low as reasonably achievable. COMPARISON: 05/14/2023 CLINICAL HISTORY: LLQ abdominal pain; Rectal pain FINDINGS: LOWER CHEST: No acute abnormality. LIVER: Subcentimeter hypodensities in the right hepatic lobe, too small to definitively characterize, possibly small cysts or biliary hematomas. GALLBLADDER AND BILE DUCTS:  Gallbladder is unremarkable. No biliary ductal dilatation. SPLEEN: No acute abnormality. PANCREAS: No acute abnormality. ADRENAL GLANDS: No acute abnormality. KIDNEYS, URETERS AND BLADDER: A couple of small subcentimeter hypodensities are also noted in both kidneys, too small to definitively characterize, but most likely small cysts. Per consensus, no follow-up is needed for simple Bosniak type 1 and 2 renal cysts, unless the patient has a malignancy history or risk factors. No stones in the kidneys or ureters. No hydronephrosis. No perinephric or periureteral stranding. Decompressed urinary bladder. The rightward aspect of the bladder is again noted to be herniating into the large right inguinal hernia, which also contains intraabdominal fat. The most inferior extent of the hernia sac is not included in the field of view. Overall, the hernia sac measures approximately 11.1 cm in craniocaudal dimension. GI AND BOWEL: Stomach demonstrates no acute abnormality. Decompressed normal appendix. Mild mucosal enhancement throughout the rectal wall. There is no bowel obstruction. PERITONEUM AND RETROPERITONEUM: No ascites. No free air. VASCULATURE: Aorta is normal in caliber. LYMPH NODES: No lymphadenopathy. REPRODUCTIVE ORGANS: Redemonstrated severe prostatomegaly. BONES AND SOFT TISSUES: Multilevel thoracic osteophytosis. No acute osseous abnormality. No focal soft tissue abnormality. IMPRESSION: 1. Mild mucosal enhancement throughout the rectal wall, which may reflect changes of and infectious or inflammatory proctitis. 2. Large right inguinal hernia containing intraabdominal fat and the majority of the herniated urinary bladder .The hernia sac measures approximately 11.1 cm in craniocaudal dimension; the most inferior extent of the hernia sac and urinary bladder is not included in the field of view. 3. Severe prostatomegaly. Electronically signed by: Rogelia Myers MD 03/31/2024 01:41 PM EST RP Workstation: [id number]     EKG/Medicine tests: Not indicated EKG Interpretation:                  Interventions: Morphine , Tylenol , Toradol , Benadryl , Augmentin   See the EMR for full details regarding lab and imaging results.  Patient presents emergency department for rectal pain, patient does not have significant prostate bogginess/tenderness on exam, therefore doubt prostatitis, labs obtained and are reassuring, therefore doubt sepsis, no evidence of UTI, patient denies any mechanical trauma to the anus.  CT obtained and does demonstrate evidence of inflammatory versus infectious proctitis.  Discussed this with patient, will prescribe course of Augmentin , as well as recommend course of anti-inflammatories for inflammatory component, and close follow-up with PCP.  Patient is comfortable with this plan, first dose of Augmentin  provided in the ED, will also discharge with Bentyl  for spasmodic pain if needed.  Presentation is most consistent with acute complicated illness and I did consider and rule out acute life/limb-threatening illness  Discussion of management or test interpretations with external provider(s): Not indicated  Risk Drugs:OTC drugs, Prescription drug management, and Parenteral controlled substances  Disposition: DISCHARGE: I believe  that the patient is safe for discharge home with outpatient follow-up. Patient was informed of all pertinent physical exam, laboratory, and imaging findings. Patient's suspected etiology of their symptom presentation was discussed with the patient and all questions were answered. We discussed following up with PCP. I provided thorough ED return precautions. The patient feels safe and comfortable with this plan.  MDM generated using voice dictation software and may contain dictation errors.  Please contact me for any clarification or with any questions.  Clinical Impression:  1. Proctitis      Discharge   Final Clinical Impression(s) / ED Diagnoses Final diagnoses:   Proctitis    Rx / DC Orders ED Discharge Orders          Ordered    amoxicillin -clavulanate (AUGMENTIN ) 875-125 MG tablet  Every 12 hours        03/31/24 1355    dicyclomine  (BENTYL ) 20 MG tablet  2 times daily PRN        03/31/24 1355             Rogelia Jerilynn RAMAN, MD 03/31/24 1518  "

## 2024-03-31 NOTE — Telephone Encounter (Signed)
 FYI Only or Action Required?: FYI only for provider: patient at ED- encouraged to stay.  Patient was last seen in primary care on 02/16/2024 by Chandra Toribio POUR, MD.  Called Nurse Triage reporting Rectal Pain.  Symptoms began today.  Interventions attempted: Nothing.  Symptoms are: unchanged.  Triage Disposition: See HCP Within 4 Hours (Or PCP Triage)  Patient/caregiver understands and will follow disposition?: Yes  Copied from CRM #[id number]. Topic: Clinical - Red Word Triage >> Mar 31, 2024  9:35 AM Rosaria BRAVO wrote: Red Word that prompted transfer to Nurse Triage: Rectal pain, at the ED. Wants to leave ED and see his PCP. Reason for Disposition  SEVERE rectal pain (e.g., excruciating, unable to have a bowel movement)  Answer Assessment - Initial Assessment Questions Patient states he woke out of sleep with severe rectal pain  and went to ED. Patient has been waiting 3 hours now. Patient advised please continue to wait and be seen. There are no open appointments within recommended disposition. Patient states he will wait.    1. SYMPTOM:  What's the main symptom you're concerned about? (e.g., pain, itching, swelling, rash)     rectal symptoms 2. ONSET: When did the pain  start?     2 days ago 3. RECTAL PAIN: Do you have any pain around your rectum? How bad is the pain?  (Scale 0-10; or none, mild, moderate, severe)     8/10, rectal pain 4. RECTAL ITCHING: Do you have any itching in this area? How bad is the itching?  (Scale 0-10; or none, mild, moderate, severe)     no 5. CONSTIPATION: Do you have constipation? If Yes, ask: How often do you have a bowel movement (BM)?  (Normal range: 3 times a day to every 3 days)  When was your last BM?       No,normal BM - yesterday hard BM- to no BM 6. CAUSE: What do you think is causing the anus symptoms?     unsure 7. OTHER SYMPTOMS: Do you have any other symptoms?  (e.g., abdomen pain, fever, rectal bleeding,  vomiting)     no  Protocols used: Rectal Symptoms-A-AH

## 2024-03-31 NOTE — ED Notes (Signed)
 Pt advised he wants to wait till he gets to a room before an IV or more blood work is done.

## 2024-03-31 NOTE — ED Triage Notes (Addendum)
 Pt came in c/o rectal pain & soreness that started 2 days ago. Currently 8/10. No N&V, or diarrhea. Pt denies injury. Denies rectal bleeding.

## 2024-03-31 NOTE — Discharge Instructions (Addendum)
 Glenn Kelly  Thank you for allowing us  to take care of you today.  You came to the Emergency Department today because you have had some pain in your rectum over the past 2 days.  Here in the emergency department you did not have much tenderness of your prostate.  We got a CT of your abdomen and pelvis which shows that you have some inflammation of your rectum, does not show any specific abnormalities of your prostate.  Your labs were otherwise reassuring.  We are going to start you on a week of antibiotics to treat a possible infection of your rectum, though it could be that this is driven by inflammation rather than infection.  We will prescribe Augmentin , you should take this twice a day for 1 week.  Additionally we would recommend using Tylenol  and ibuprofen  every 6 hours, this not only will help with pain, however will also help with the inflammation in your rectum.  Will also prescribe Bentyl , this is a antispasmodic medication for your intestines, you can use this twice a day as needed.  We will prescribe 2 doses of oxycodone that you can use for breakthrough pain.   To-Do: 1. Please follow-up with your primary doctor within 1 - 2 weeks / as soon as possible.   Please return to the Emergency Department or call 911 if you experience have worsening of your symptoms, or do not get better, chest pain, shortness of breath, severe or significantly worsening pain, high fever, severe confusion, pass out or have any reason to think that you need emergency medical care.   We hope you feel better soon.   Mitzie Later, MD Department of Emergency Medicine [facility] 

## 2024-03-31 NOTE — ED Notes (Signed)
 Pt is aware we need urine.

## 2024-04-09 ENCOUNTER — Encounter: Payer: Self-pay | Admitting: Family Medicine

## 2024-04-09 ENCOUNTER — Ambulatory Visit (INDEPENDENT_AMBULATORY_CARE_PROVIDER_SITE_OTHER): Admitting: Family Medicine

## 2024-04-09 ENCOUNTER — Inpatient Hospital Stay: Admitting: Family Medicine

## 2024-04-09 VITALS — BP 110/67 | HR 80 | Ht 66.0 in | Wt 159.8 lb

## 2024-04-09 DIAGNOSIS — N401 Enlarged prostate with lower urinary tract symptoms: Secondary | ICD-10-CM | POA: Diagnosis not present

## 2024-04-09 DIAGNOSIS — K6289 Other specified diseases of anus and rectum: Secondary | ICD-10-CM | POA: Diagnosis not present

## 2024-04-09 DIAGNOSIS — R350 Frequency of micturition: Secondary | ICD-10-CM

## 2024-04-09 DIAGNOSIS — K4090 Unilateral inguinal hernia, without obstruction or gangrene, not specified as recurrent: Secondary | ICD-10-CM | POA: Diagnosis not present

## 2024-04-09 MED ORDER — AMOXICILLIN-POT CLAVULANATE 875-125 MG PO TABS: 1.0000 | ORAL_TABLET | Freq: Two times a day (BID) | ORAL | 0 refills | Status: AC

## 2024-04-09 MED ORDER — DOCUSATE SODIUM 50 MG/5ML PO LIQD: ORAL | 0 refills | Status: AC

## 2024-04-09 NOTE — Assessment & Plan Note (Signed)
 Benign prostatic hyperplasia with lower urinary tract symptoms Severely enlarged prostate noted on ct causing urinary symptoms. Previous PSA levels slightly elevated,  going from 0.5 3 years ago to 5.4 3 months ago to 4.4 most recently. Was using cardura  but no longer taking this. Discussed finasteride to shrink prostate, Risks include erectile dysfunction. Urology referral recommended to discuss options for mgmt of his severe prostatomegaly. - Referred to urology for further management - Discussed starting finasteride, option to wait until after urology consultation.

## 2024-04-09 NOTE — Progress Notes (Signed)
 "  Established Patient Office Visit  Subjective   Patient ID: Glenn Kelly, male    DOB: 11-Apr-1970  Age: 54 y.o. MRN: [id number]  Chief Complaint  Patient presents with   Hospitalization Follow-up       History of Present Illness   Glenn Kelly is a 54 year old male with proctitis who presents with ongoing rectal irritation.  He was recently treated in the emergency department for proctitis and completed a 7 day course of Augmentin  last night. The antibiotic improved his symptoms, but he still has mild residual rectal irritation.  He was also prescribed medication for intestinal spasms but is unsure if he needs it and has not been taking it regularly. He has been busy with work and has not picked up some of his prescribed medications.  He has an enlarged prostate and has not started Cardura  for urinary symptoms because he did not pick up the prescription. He reports no major change in urinary symptoms and notes PSA levels have been slightly elevated in the past.  He has a hernia and has not yet followed up with a general surgeon.          The 10-year ASCVD risk score (Arnett DK, et al., 2019) is: 4.3%  Health Maintenance Due  Topic Date Due   HIV Screening  Never done   Hepatitis C Screening  Never done   DTaP/Tdap/Td (1 - Tdap) Never done   Hepatitis B Vaccines 19-59 Average Risk (1 of 3 - 19+ 3-dose series) Never done   Pneumococcal Vaccine: 50+ Years (1 of 1 - PCV) Never done   Zoster Vaccines- Shingrix (1 of 2) Never done   COVID-19 Vaccine (1 - 2025-26 season) Never done      Objective:     BP 110/67   Pulse 80   Ht 5' 6 (1.676 m)   Wt 159 lb 12.8 oz (72.5 kg)   SpO2 100%   BMI 25.79 kg/m    Physical Exam     Gen: alert, oriented Pulm: no respiratory distress Psych: pleasant affect       No results found for any visits on 04/09/24.      Assessment & Plan:   Right inguinal hernia Assessment & Plan: Inguinal herniation includes a  significant portion of the bladder seen on CT scan.    - Referred to general surgery for evaluation and management.  has seen provider in [place] before but did not follow up   Orders: -     Ambulatory referral to General Surgery  Benign prostatic hyperplasia with urinary frequency Assessment & Plan: Benign prostatic hyperplasia with lower urinary tract symptoms Severely enlarged prostate noted on ct causing urinary symptoms. Previous PSA levels slightly elevated,  going from 0.5 3 years ago to 5.4 3 months ago to 4.4 most recently. Was using cardura  but no longer taking this. Discussed finasteride to shrink prostate, Risks include erectile dysfunction. Urology referral recommended to discuss options for mgmt of his severe prostatomegaly. - Referred to urology for further management - Discussed starting finasteride, option to wait until after urology consultation.  Orders: -     Ambulatory referral to Urology  Proctitis Assessment & Plan: Recent episode treated with Augmentin , showing improvement. Residual irritation persists but is less severe. CT scan indicated inflammation of the rectal wall . - Prescribed additional 5 days of Augmentin  to complete 12-day course.   Other orders -     Docusate Sodium ; Apply 5 drops to the  ears and leave in for 5 minutes daily to help with cerumen impaction  Dispense: 237 mL; Refill: 0 -     Amoxicillin -Pot Clavulanate; Take 1 tablet by mouth every 12 (twelve) hours for 5 days.  Dispense: 10 tablet; Refill: 0       No follow-ups on file.    Glenn MARLA Slain, MD  "

## 2024-04-09 NOTE — Assessment & Plan Note (Signed)
 Inguinal herniation includes a significant portion of the bladder seen on CT scan.    - Referred to general surgery for evaluation and management.  has seen provider in [place] before but did not follow up

## 2024-04-09 NOTE — Assessment & Plan Note (Signed)
 Recent episode treated with Augmentin , showing improvement. Residual irritation persists but is less severe. CT scan indicated inflammation of the rectal wall . - Prescribed additional 5 days of Augmentin  to complete 12-day course.

## 2024-04-09 NOTE — Patient Instructions (Signed)
" ° °  YOUR PLAN: PROCTITIS: You have ongoing rectal irritation after a recent episode of proctitis. -Continue taking Augmentin  for an additional 5 days to complete a 12-day course.  BENIGN PROSTATIC HYPERPLASIA WITH LOWER URINARY TRACT SYMPTOMS: You have an enlarged prostate causing urinary symptoms. -You have been referred to urology for further management and potential procedures. -We discussed starting finasteride to shrink your prostate, which may take 3-4 months to show effects. This medication may cause reversible erectile dysfunction.   -You have been referred to general surgery for evaluation and management of your hernia  CERUMEN IMPACTION: You have ear wax buildup. -A prescription for liquid Colace has been sent to your pharmacy for ear wax management.    "

## 2024-04-26 ENCOUNTER — Ambulatory Visit: Payer: PRIVATE HEALTH INSURANCE | Admitting: Surgery

## 2024-05-07 ENCOUNTER — Ambulatory Visit: Payer: PRIVATE HEALTH INSURANCE | Admitting: Surgery

## 2024-06-04 ENCOUNTER — Ambulatory Visit: Admitting: Urology

## 2024-08-17 ENCOUNTER — Ambulatory Visit: Admitting: Family Medicine
